# Patient Record
Sex: Female | Born: 1990 | Race: White | Hispanic: No | Marital: Single | Smoking: Never smoker
Health system: Southern US, Community
[De-identification: ages and names within clinical notes are randomized; demographics above are authoritative.]

## PROBLEM LIST (undated history)

## (undated) DIAGNOSIS — F329 Major depressive disorder, single episode, unspecified: Secondary | ICD-10-CM

## (undated) DIAGNOSIS — F32A Depression, unspecified: Secondary | ICD-10-CM

## (undated) DIAGNOSIS — J45909 Unspecified asthma, uncomplicated: Secondary | ICD-10-CM

## (undated) HISTORY — DX: Depression, unspecified: F32.A

## (undated) HISTORY — DX: Major depressive disorder, single episode, unspecified: F32.9

## (undated) HISTORY — PX: TONSILLECTOMY: SUR1361

---
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
## 2012-02-06 ENCOUNTER — Other Ambulatory Visit: Payer: Self-pay | Admitting: *Deleted

## 2012-02-06 MED ORDER — NORGESTIM-ETH ESTRAD TRIPHASIC 0.18/0.215/0.25 MG-25 MCG PO TABS: 1.0000 | ORAL_TABLET | Freq: Every day | ORAL | Status: DC

## 2012-07-05 ENCOUNTER — Encounter: Payer: Self-pay | Admitting: Emergency Medicine

## 2012-07-05 ENCOUNTER — Emergency Department (INDEPENDENT_AMBULATORY_CARE_PROVIDER_SITE_OTHER)
Admission: EM | Admit: 2012-07-05 | Discharge: 2012-07-05 | Disposition: A | Discharge status: Home / Self Care | Payer: 59 | Source: Home / Self Care | Attending: Family Medicine | Admitting: Family Medicine

## 2012-07-05 DIAGNOSIS — J029 Acute pharyngitis, unspecified: Secondary | ICD-10-CM

## 2012-07-05 DIAGNOSIS — J309 Allergic rhinitis, unspecified: Secondary | ICD-10-CM

## 2012-07-05 MED ORDER — FLUTICASONE PROPIONATE 50 MCG/ACT NA SUSP: 2.0000 | Freq: Every day | NASAL | Status: DC

## 2012-07-05 MED ORDER — CETIRIZINE HCL 10 MG PO CAPS: 10.0000 mg | ORAL_CAPSULE | Freq: Every day | ORAL | Status: DC

## 2012-07-05 NOTE — ED Provider Notes (Signed)
History     CSN: [id number]  Arrival date & time 07/05/12  0945   First MD Initiated Contact with Patient 07/05/12 1022      Chief Complaint  Patient presents with  . Sore Throat   HPI  URI Symptoms Onset: 2-3 weeks  Description: intermittent sore throat and cervical LAD Modifying factors:  Baseline hx/o allergies. Intermittently taking antihistamine.   Symptoms Nasal discharge: mild post nasal drip  Fever: n Sore throat: yes Cough: no Wheezing: no Ear pain: no GI symptoms: no Sick contacts: yes  Red Flags  Stiff neck: no Dyspnea: no Rash: no Swallowing difficulty: no  Sinusitis Risk Factors Headache/face pain: no Double sickening: no tooth pain: no  Allergy Risk Factors Sneezing: mild Itchy scratchy throat: yes Seasonal symptoms: yes  Flu Risk Factors Headache: no muscle aches: no severe fatigue: no   Past Medical History  Diagnosis Date  . Depression     Past Surgical History  Procedure Laterality Date  . Tonsillectomy      Family History  Problem Relation Age of Onset  . Diabetes Maternal Grandfather   . Diabetes Paternal Grandmother   . Cancer Paternal Grandfather     History  Substance Use Topics  . Smoking status: Never Smoker   . Smokeless tobacco: Not on file  . Alcohol Use: Not on file    OB History   Grav Para Term Preterm Abortions TAB SAB Ect Mult Living                  Review of Systems  All other systems reviewed and are negative.    Allergies  Sulfonamide derivatives  Home Medications   Current Outpatient Rx  Name  Route  Sig  Dispense  Refill  . Norgestimate-Ethinyl Estradiol Triphasic (ORTHO TRI-CYCLEN LO) 0.18/0.215/0.25 MG-25 MCG tab   Oral   Take 1 tablet by mouth daily.   1 Package   11   . sertraline (ZOLOFT) 100 MG tablet   Oral   Take 100 mg by mouth daily.           BP 129/84  Pulse 67  Temp(Src) 98.1 F (36.7 C) (Oral)  Ht 5\' 3"  (1.6 m)  Wt 158 lb (71.668 kg)  BMI 28 kg/m2   SpO2 96%  LMP 06/27/2012  Physical Exam  Constitutional: She appears well-developed and well-nourished.  HENT:  Head: Normocephalic and atraumatic.  Right Ear: External ear normal.  Left Ear: External ear normal.  +nasal erythema, rhinorrhea bilaterally, + post oropharyngeal erythema  S/p tonsillectomy     Eyes: Conjunctivae are normal. Pupils are equal, round, and reactive to light.  Neck: Normal range of motion. Neck supple.  Cardiovascular: Normal rate, regular rhythm and normal heart sounds.   Pulmonary/Chest: Effort normal and breath sounds normal.  Abdominal: Soft.  Musculoskeletal: Normal range of motion.  Lymphadenopathy:    She has cervical adenopathy.  Neurological: She is alert.  Skin: Skin is warm.    ED Course  Procedures (including critical care time)  Labs Reviewed  POCT MONO SCREEN [facility])   No results found.   1. Allergic rhinitis       MDM  Rapid strep and mono negative.  Suspect partially treated allergic rhinitis.  Will start on flonase and zyrtec.  Discussed general care and infectious/ENT red flags.  Follow up as needed.     The patient and/or caregiver has been counseled thoroughly with regard to treatment plan and/or medications prescribed including dosage, schedule, interactions, rationale  for use, and possible side effects and they verbalize understanding. Diagnoses and expected course of recovery discussed and will return if not improved as expected or if the condition worsens. Patient and/or caregiver verbalized understanding.             Doree Albee, MD 07/05/12 1047

## 2012-07-05 NOTE — ED Notes (Signed)
Sore throat, intermittent for a few weeks, worse yesterday

## 2012-11-22 ENCOUNTER — Encounter: Payer: Self-pay | Admitting: Physician Assistant

## 2012-11-22 ENCOUNTER — Ambulatory Visit (INDEPENDENT_AMBULATORY_CARE_PROVIDER_SITE_OTHER): Payer: 59 | Admitting: Physician Assistant

## 2012-11-22 ENCOUNTER — Other Ambulatory Visit: Payer: Self-pay | Admitting: Physician Assistant

## 2012-11-22 VITALS — BP 150/78 | HR 88 | Wt 155.0 lb

## 2012-11-22 DIAGNOSIS — N920 Excessive and frequent menstruation with regular cycle: Secondary | ICD-10-CM

## 2012-11-22 DIAGNOSIS — R030 Elevated blood-pressure reading, without diagnosis of hypertension: Secondary | ICD-10-CM

## 2012-11-22 NOTE — Progress Notes (Signed)
  Subjective:    Patient ID: Anita Baird, female    DOB: 1990/06/21, 22 y.o.   MRN: [id number]  HPI Patient presents to the clinic because she has had vaginal bleeding for 4 weeks. Her menstrual cycle started July 12th and is still occuring. Her bleeding has decreased significantly and on a daily basis only having to change tampon twice a day. She denies any abdominal pain other than the mild cramps at the start of her period. She denies any new medications. She started OCP about 1 year ago with no problems until now. She is sexually active. Denies any discharge. No pain during intercourse. Stress and activity level the same. She has not had any vaginal itching or odor.     Review of Systems  All other systems reviewed and are negative.       Objective:   Physical Exam  Constitutional: She is oriented to person, place, and time. She appears well-developed and well-nourished.  HENT:  Head: Normocephalic and atraumatic.  Neck: Normal range of motion. Neck supple. No thyromegaly present.  Cardiovascular: Normal rate, regular rhythm and normal heart sounds.   Pulmonary/Chest: Effort normal and breath sounds normal.  Abdominal: Soft. Bowel sounds are normal. She exhibits no distension and no mass. There is no tenderness. There is no rebound and no guarding.  Neurological: She is alert and oriented to person, place, and time.  Skin: Skin is warm and dry.  Psychiatric: She has a normal mood and affect. Her behavior is normal.          Assessment & Plan:  Menorrhagia- Stay on OCP. Will check TSH, prolactin and CBC. Will get pelvic ultrasound. If everything is negative could consider increasing OCP or putting on a trial of progesterone. Will call pt with results.   Elevated blood pressure reading- inadvertently forgot to recheck during encounter. No hx of BP issues. Will continue to monitor at future visits. Pt was certainly nervous about what she was talking about today and could have  increased BP.

## 2012-11-23 ENCOUNTER — Ambulatory Visit (HOSPITAL_BASED_OUTPATIENT_CLINIC_OR_DEPARTMENT_OTHER)
Admission: RE | Admit: 2012-11-23 | Discharge: 2012-11-23 | Disposition: A | Discharge status: Home / Self Care | Payer: 59 | Source: Ambulatory Visit | Attending: Physician Assistant | Admitting: Physician Assistant

## 2012-11-23 DIAGNOSIS — N920 Excessive and frequent menstruation with regular cycle: Secondary | ICD-10-CM | POA: Insufficient documentation

## 2012-11-23 LAB — CBC
HCT: 41.3 % (ref 36.0–46.0)
MCHC: 33.9 g/dL (ref 30.0–36.0)
MCV: 85.9 fL (ref 78.0–100.0)
RDW: 13.1 % (ref 11.5–15.5)

## 2012-11-24 ENCOUNTER — Other Ambulatory Visit: Payer: Self-pay | Admitting: Physician Assistant

## 2012-11-24 MED ORDER — NORGESTIM-ETH ESTRAD TRIPHASIC 0.18/0.215/0.25 MG-35 MCG PO TABS: 1.0000 | ORAL_TABLET | Freq: Every day | ORAL | Status: DC

## 2012-11-24 NOTE — Progress Notes (Signed)
Sent regular ortho tri cyclen instead of low.

## 2013-02-01 ENCOUNTER — Other Ambulatory Visit: Payer: Self-pay | Admitting: *Deleted

## 2013-02-01 MED ORDER — NORGESTIM-ETH ESTRAD TRIPHASIC 0.18/0.215/0.25 MG-35 MCG PO TABS: 1.0000 | ORAL_TABLET | Freq: Every day | ORAL | Status: DC

## 2013-07-14 ENCOUNTER — Other Ambulatory Visit: Payer: Self-pay | Admitting: Family Medicine

## 2013-10-11 ENCOUNTER — Other Ambulatory Visit: Payer: Self-pay | Admitting: Physician Assistant

## 2013-12-06 ENCOUNTER — Ambulatory Visit (INDEPENDENT_AMBULATORY_CARE_PROVIDER_SITE_OTHER): Payer: 59 | Admitting: Physician Assistant

## 2013-12-06 ENCOUNTER — Encounter: Payer: Self-pay | Admitting: Physician Assistant

## 2013-12-06 VITALS — BP 117/79 | HR 76 | Ht 63.0 in | Wt 156.0 lb

## 2013-12-06 DIAGNOSIS — J45909 Unspecified asthma, uncomplicated: Secondary | ICD-10-CM

## 2013-12-06 DIAGNOSIS — J302 Other seasonal allergic rhinitis: Secondary | ICD-10-CM

## 2013-12-06 DIAGNOSIS — N921 Excessive and frequent menstruation with irregular cycle: Secondary | ICD-10-CM

## 2013-12-06 DIAGNOSIS — J309 Allergic rhinitis, unspecified: Secondary | ICD-10-CM

## 2013-12-06 DIAGNOSIS — Z3009 Encounter for other general counseling and advice on contraception: Secondary | ICD-10-CM

## 2013-12-06 DIAGNOSIS — N920 Excessive and frequent menstruation with regular cycle: Secondary | ICD-10-CM

## 2013-12-06 MED ORDER — MONTELUKAST SODIUM 10 MG PO TABS: 10.0000 mg | ORAL_TABLET | Freq: Every day | ORAL | Status: DC

## 2013-12-06 MED ORDER — NORGESTIM-ETH ESTRAD TRIPHASIC 0.18/0.215/0.25 MG-35 MCG PO TABS: ORAL_TABLET | ORAL | Status: DC

## 2013-12-06 MED ORDER — CETIRIZINE-PSEUDOEPHEDRINE ER 5-120 MG PO TB12
1.0000 | ORAL_TABLET | Freq: Two times a day (BID) | ORAL | Status: DC
Start: 2013-12-06 — End: 2017-11-09

## 2013-12-06 MED ORDER — ALBUTEROL SULFATE 108 (90 BASE) MCG/ACT IN AEPB: 2.0000 | INHALATION_SPRAY | RESPIRATORY_TRACT | Status: DC | PRN

## 2013-12-06 NOTE — Progress Notes (Signed)
   Subjective:    Patient ID: Anita Baird, female    DOB: June 25, 1990, 23 y.o.   MRN: [id number]  HPI Pt presents to the clinic to follow up for OCP. She has been on OCP for over a year and has no problems or concerns. Periods are monthly and regular. No abnormal bleeding or cramping.   Pt also reports using rescue inhaler almost every day for past 3 months. NO ER visit and not causing her to not do physical activity. She does complain of chest tightness, wheezing and SOB. She feels like trigger more by going outside. She does have seasonal allergies. She used to be on singulair and helped a lot.    Review of Systems  All other systems reviewed and are negative.      Objective:   Physical Exam  Constitutional: She is oriented to person, place, and time. She appears well-developed and well-nourished.  HENT:  Head: Normocephalic and atraumatic.  Cardiovascular: Normal rate, regular rhythm and normal heart sounds.   Pulmonary/Chest: Effort normal and breath sounds normal.  Neurological: She is alert and oriented to person, place, and time.  Psychiatric: She has a normal mood and affect. Her behavior is normal.          Assessment & Plan:  Extrinsic asthma, mild/seasonal allergies- Will add back Singulair  daily. Continue with zyrtec D once daily. Albuterol inhaler given to use as needed. Discussed if continues to use rescue more than 3 times a week to follow up in clinic to discuss.   Contraception management- refilled OCP for one year. Discussed pap smear next year. Pt aware OCP not protect against STI and to use condoms.  decines gardisil.   Discussed with patient need for fasting labs. She would like to wait until CPE next year.    Pt declined flu shot today.

## 2013-12-12 ENCOUNTER — Encounter: Payer: Self-pay | Admitting: Physician Assistant

## 2013-12-12 ENCOUNTER — Ambulatory Visit (INDEPENDENT_AMBULATORY_CARE_PROVIDER_SITE_OTHER): Payer: 59 | Admitting: Physician Assistant

## 2013-12-12 VITALS — BP 119/70 | HR 79 | Temp 98.2°F | Ht 63.0 in | Wt 156.0 lb

## 2013-12-12 DIAGNOSIS — J45909 Unspecified asthma, uncomplicated: Secondary | ICD-10-CM

## 2013-12-12 DIAGNOSIS — J0100 Acute maxillary sinusitis, unspecified: Secondary | ICD-10-CM | POA: Insufficient documentation

## 2013-12-12 MED ORDER — METHYLPREDNISOLONE SODIUM SUCC 125 MG IJ SOLR
125.0000 mg | Freq: Once | INTRAMUSCULAR | Status: AC
  Administered 2013-12-12: 125 mg via INTRAMUSCULAR

## 2013-12-12 MED ORDER — AMOXICILLIN-POT CLAVULANATE 875-125 MG PO TABS: 1.0000 | ORAL_TABLET | Freq: Two times a day (BID) | ORAL | Status: DC

## 2013-12-12 NOTE — Patient Instructions (Signed)

## 2013-12-13 NOTE — Progress Notes (Signed)
   Subjective:    Patient ID: Anita Baird, female    DOB: [birth year], 22 y.o.   MRN: [id number]  HPI Pt reports to the clinic with worsening ST, sinus pressure, nasal congestion. She has had symptoms for over a week at this point. She had OV last week and treated for extrinsic asthma and allergies. She feels like she can breath a little better but still feels tight in her chest. Albuterol inhaler does seem to help. No fever, chills. She has used sudafed with no relief. Continues to take zyrtec D and singulair.    Review of Systems  All other systems reviewed and are negative.      Objective:   Physical Exam  Constitutional: She is oriented to person, place, and time. She appears well-developed and well-nourished.  HENT:  Head: Normocephalic and atraumatic.  Right Ear: External ear normal.  Left Ear: External ear normal.  Mouth/Throat: Oropharynx is clear and moist. No oropharyngeal exudate.  TM's clear bilaterally clear. No blood or pus.   Left sided maxillary tenderness.   Nasal turbinates red and swollen.   Eyes: Conjunctivae are normal. Right eye exhibits no discharge. Left eye exhibits no discharge.  Neck: Normal range of motion. Neck supple.  Cardiovascular: Normal rate, regular rhythm and normal heart sounds.   Pulmonary/Chest: Effort normal and breath sounds normal. She has no wheezes.  Lymphadenopathy:    She has no cervical adenopathy.  Neurological: She is alert and oriented to person, place, and time.  Skin: Skin is dry.  Psychiatric: She has a normal mood and affect. Her behavior is normal.          Assessment & Plan:  Acute maxillary sinusitis/extrinsic asthma- solu-medrol  given IM today. augmentin for 10 days. Continue albuterol rescue as needed. If not improving may need to consider daily ICS.  please follow up in 1 week if symptoms not improving. HO given.

## 2014-06-01 ENCOUNTER — Encounter: Payer: Self-pay | Admitting: *Deleted

## 2014-06-01 ENCOUNTER — Emergency Department (INDEPENDENT_AMBULATORY_CARE_PROVIDER_SITE_OTHER)
Admission: EM | Admit: 2014-06-01 | Discharge: 2014-06-01 | Disposition: A | Discharge status: Home / Self Care | Payer: 59 | Source: Home / Self Care | Attending: Emergency Medicine | Admitting: Emergency Medicine

## 2014-06-01 DIAGNOSIS — J209 Acute bronchitis, unspecified: Secondary | ICD-10-CM | POA: Diagnosis not present

## 2014-06-01 DIAGNOSIS — J0101 Acute recurrent maxillary sinusitis: Secondary | ICD-10-CM

## 2014-06-01 DIAGNOSIS — J4521 Mild intermittent asthma with (acute) exacerbation: Secondary | ICD-10-CM | POA: Diagnosis not present

## 2014-06-01 HISTORY — DX: Unspecified asthma, uncomplicated: J45.909

## 2014-06-01 MED ORDER — AMOXICILLIN-POT CLAVULANATE 875-125 MG PO TABS: 1.0000 | ORAL_TABLET | Freq: Two times a day (BID) | ORAL | Status: DC

## 2014-06-01 MED ORDER — METHYLPREDNISOLONE ACETATE 80 MG/ML IJ SUSP
80.0000 mg | Freq: Once | INTRAMUSCULAR | Status: AC
  Administered 2014-06-01: 80 mg via INTRAMUSCULAR

## 2014-06-01 NOTE — ED Provider Notes (Signed)
CSN: [id number]     Arrival date & time 06/01/14  1103 History   First MD Initiated Contact with Patient 06/01/14 1123     Chief Complaint  Patient presents with  . Cough  . Nasal Congestion   (Consider location/radiation/quality/duration/timing/severity/associated sxs/prior Treatment) HPI URI HISTORY  Astha is a 24 y.o. female who complains of onset of cough and cold symptoms for 4 days. She feels as if her asthma has flared up.  Have been using over-the-counter treatment which helps a little bit.  Tried albuterol HFA at home, and that helped minimally.  No chills/sweats +  Fever  +  Nasal congestion +  Discolored Post-nasal drainage Mild sinus pain/pressure Mild sore throat  +  Cough, productive of discolored sputum Mild, episodic wheezing Positive chest congestion No hemoptysis No shortness of breath No pleuritic pain  No itchy/red eyes No earache  No nausea No vomiting No abdominal pain No diarrhea  No skin rashes +  Fatigue No myalgias No headache   Past Medical History  Diagnosis Date  . Depression   . Asthma    Past Surgical History  Procedure Laterality Date  . Tonsillectomy     Family History  Problem Relation Age of Onset  . Diabetes Maternal Grandfather   . Diabetes Paternal Grandmother   . Cancer Paternal Grandfather    History  Substance Use Topics  . Smoking status: Never Smoker   . Smokeless tobacco: Not on file  . Alcohol Use: Not on file   OB History    No data available     Review of Systems  All other systems reviewed and are negative.   Allergies  Septra and Sulfonamide derivatives  Home Medications   Prior to Admission medications   Medication Sig Start Date End Date Taking? Authorizing Provider  Albuterol Sulfate (PROAIR RESPICLICK) 108 (90 BASE) MCG/ACT AEPB Inhale 2 puffs into the lungs every 4 (four) hours as needed. 12/06/13   Jade L Breeback, PA-C  amoxicillin-clavulanate (AUGMENTIN) 875-125 MG per tablet Take 1  tablet by mouth 2 (two) times daily. For 10 days. Take with food. 06/01/14   Lajean Manesavid Massey, MD  cetirizine-pseudoephedrine (ZYRTEC-D) 5-120 MG per tablet Take 1 tablet by mouth 2 (two) times daily. 12/06/13   Jade L Breeback, PA-C  montelukast (SINGULAIR) 10 MG tablet Take 1 tablet (10 mg total) by mouth at bedtime. 12/06/13   Jade L Breeback, PA-C  Norgestimate-Ethinyl Estradiol Triphasic (TRI-PREVIFEM) 0.18/0.215/0.25 MG-35 MCG tablet TAKE 1 TABLET BY MOUTH DAILY. 12/06/13   Jade L Breeback, PA-C   BP 135/87 mmHg  Pulse 98  Temp(Src) 98.2 F (36.8 C) (Oral)  Resp 16  Wt 157 lb (71.215 kg)  SpO2 100%  LMP 05/31/2014 Physical Exam  Constitutional: She is oriented to person, place, and time. She appears well-developed and well-nourished. No distress.  HENT:  Head: Normocephalic and atraumatic.  Right Ear: Tympanic membrane, external ear and ear canal normal.  Left Ear: Tympanic membrane, external ear and ear canal normal.  Nose: Mucosal edema and rhinorrhea present. Right sinus exhibits maxillary sinus tenderness. Left sinus exhibits maxillary sinus tenderness.  Mouth/Throat: Oropharynx is clear and moist. No oral lesions. No oropharyngeal exudate.  Eyes: Right eye exhibits no discharge. Left eye exhibits no discharge. No scleral icterus.  Neck: Neck supple.  Cardiovascular: Normal rate, regular rhythm and normal heart sounds.   Pulmonary/Chest: Effort normal. She has wheezes (mild, late expiratory. Breath sounds equal bilaterally). She has rhonchi. She has no rales.  Lymphadenopathy:  She has no cervical adenopathy.  Neurological: She is alert and oriented to person, place, and time.  Skin: Skin is warm and dry.  Psychiatric: She has a normal mood and affect.  Nursing note and vitals reviewed.   ED Course  Procedures (including critical care time) Labs Review Labs Reviewed - No data to display  Imaging Review No results found.   MDM   1. Acute bronchitis, unspecified organism    2. Asthma with acute exacerbation, mild intermittent   3. Acute recurrent maxillary sinusitis    Treatment options discussed, as well as risks, benefits, alternatives. Patient voiced understanding and agreement with the following plans: Discharge Medication List as of 06/01/2014 12:02 PM    START taking these medications   Details  amoxicillin-clavulanate (AUGMENTIN) 875-125 MG per tablet Take 1 tablet by mouth 2 (two) times daily. For 10 days. Take with food., Starting 06/01/2014, Until Discontinued, Normal       Depo-Medrol 80 mg IM stat Albuterol HFA Q4 to 6 hours as needed. She declined DuoNeb nebulizer here in urgent care.  She declined oral steroids  Other symptomatic care discussed. Follow-up with your primary care doctor in 2-3 days if not improving, or sooner if symptoms become worse. Precautions discussed. Red flags discussed. Questions invited and answered. Patient voiced understanding and agreement.     Lajean Manes, MD 06/01/14 [place]

## 2014-06-01 NOTE — ED Notes (Signed)
Anita Baird c/o sore throat, HA, runny nose x 3 days. Then developed productive cough. H/o asthma that has flared up.

## 2014-11-06 IMAGING — US US TRANSVAGINAL NON-OB
1 series · 14 of 25 positions shown · non-contrast
Comparison: None

CLINICAL DATA: Menorrhagia.  LMP 10/21/2012.

TRANSABDOMINAL AND TRANSVAGINAL ULTRASOUND OF PELVIS
TECHNIQUE: Both transabdominal and transvaginal ultrasound
examinations of the pelvis were performed. Transabdominal technique
was performed for global imaging of the pelvis including uterus,
ovaries, adnexal regions, and pelvic cul-de-sac.
It was necessary to proceed with endovaginal exam following the
transabdominal exam to visualize the uterus, endometrium, ovaries,
and adnexa.

[Series 1: us transvaginal non-ob · 0.23mm/px · 14 of 50 slices shown]
[im 1/50]
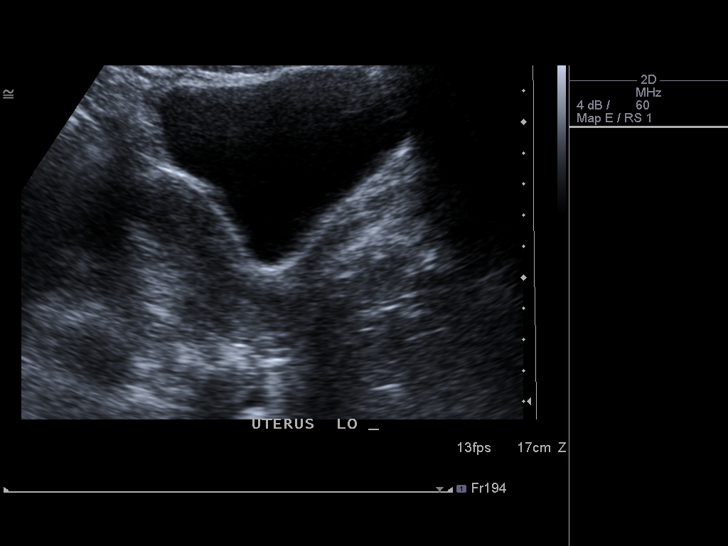
[im 5/50]
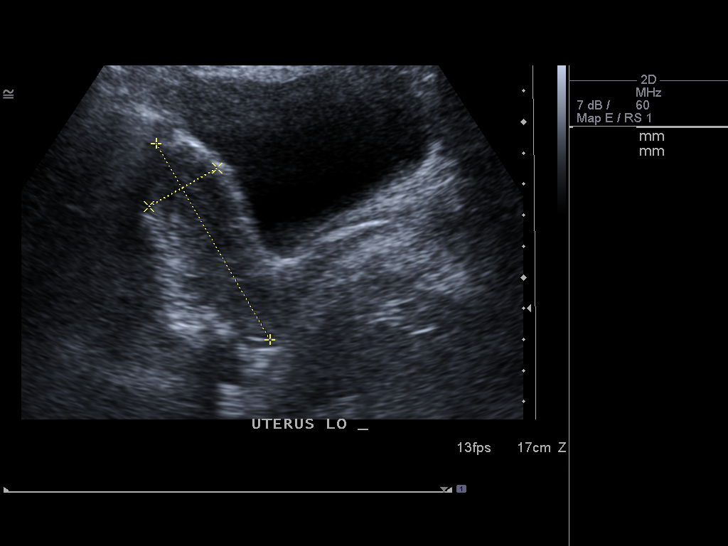
[im 9/50]
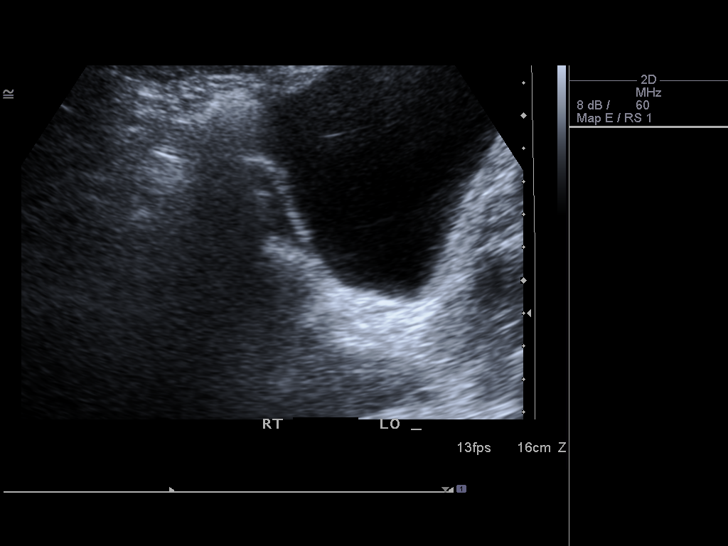
[im 13/50]
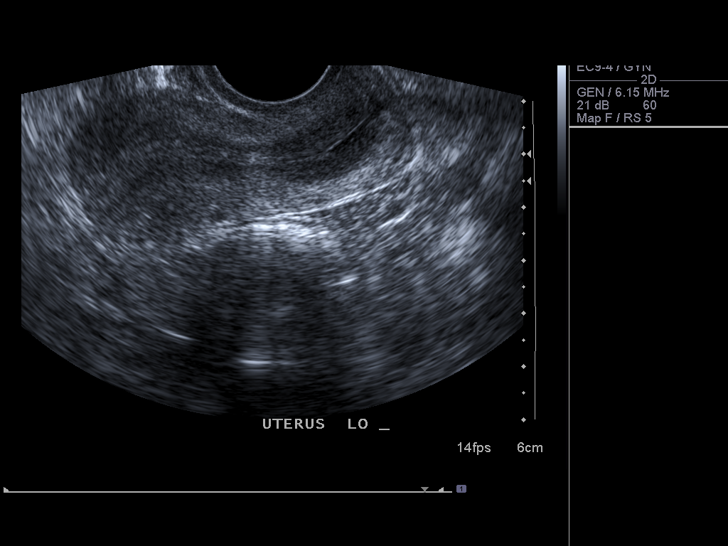
[im 17/50]
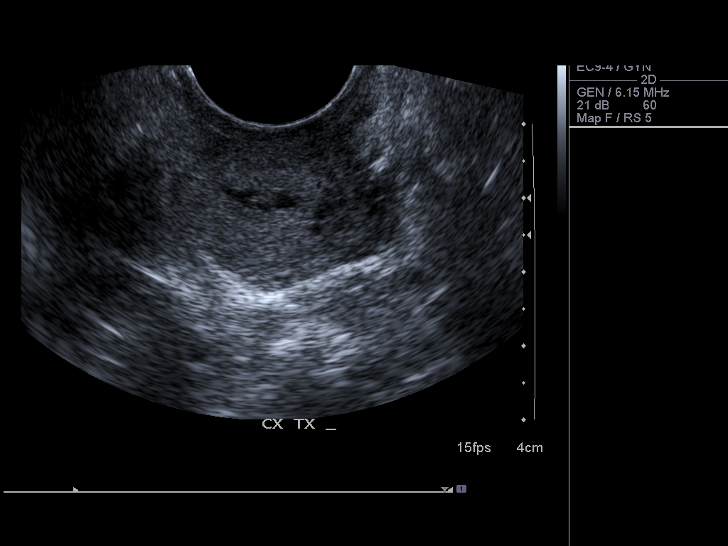
[im 19/50]
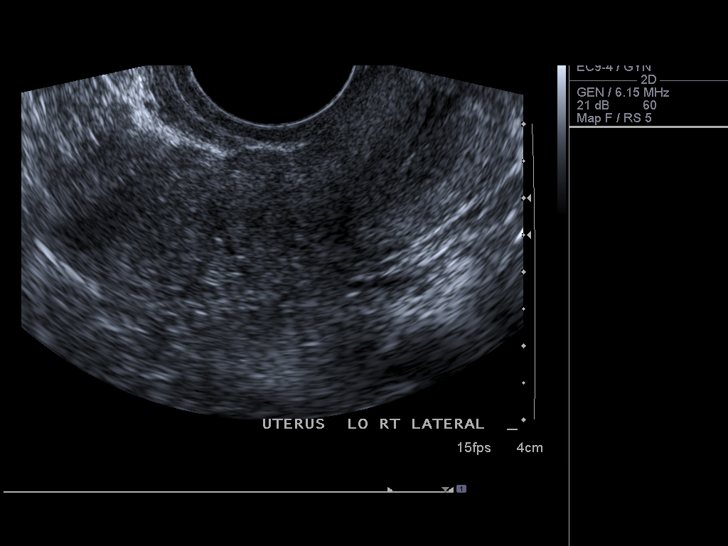
[im 23/50]
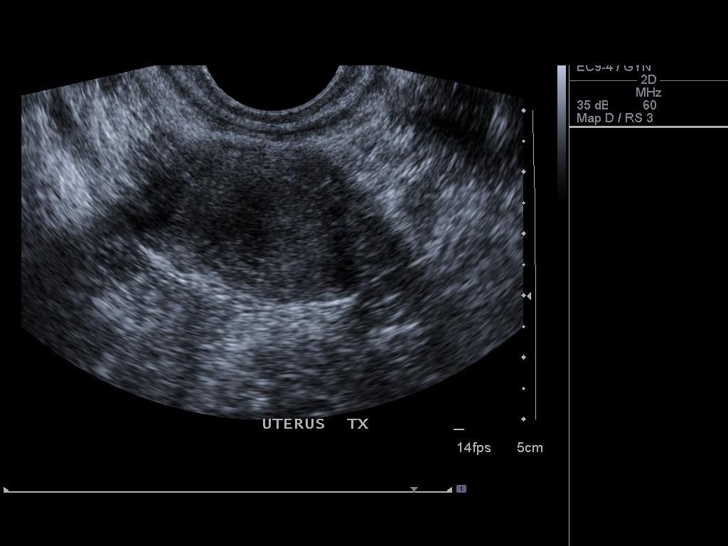
[im 27/50]
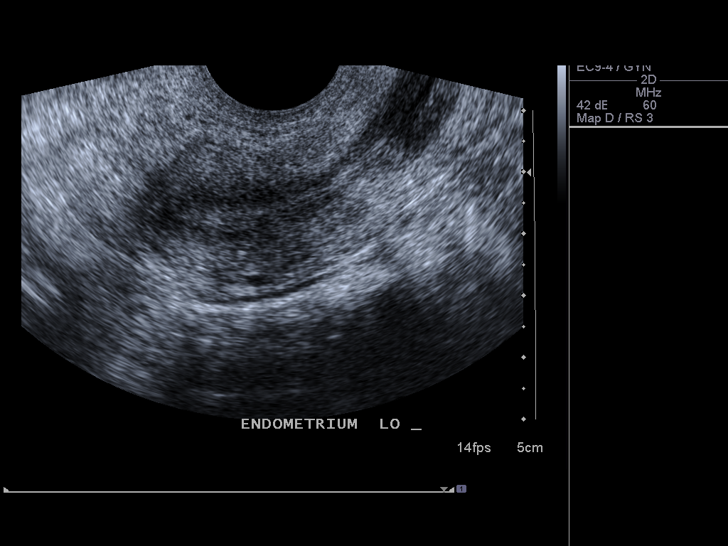
[im 31/50]
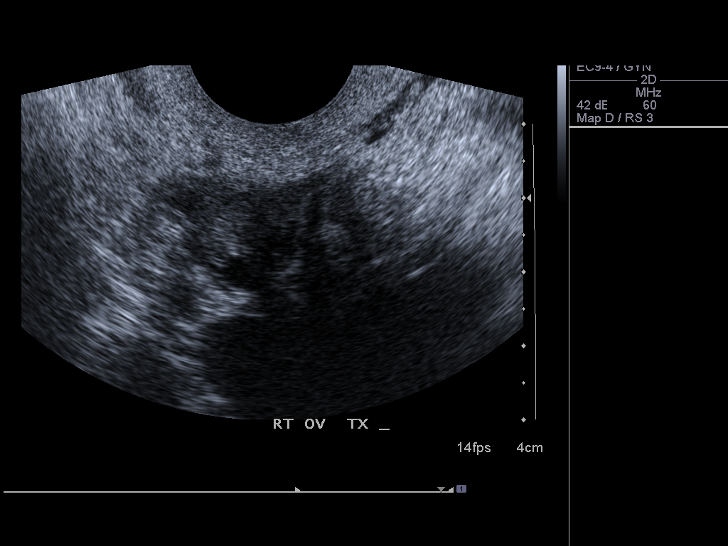
[im 33/50]
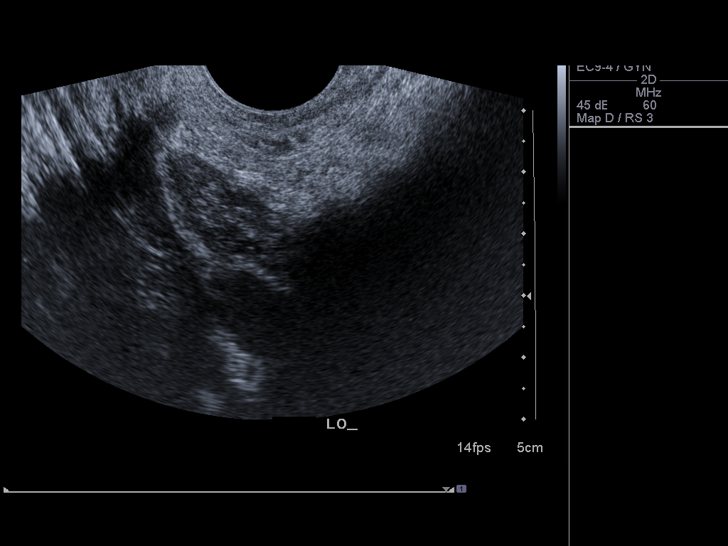
[im 37/50]
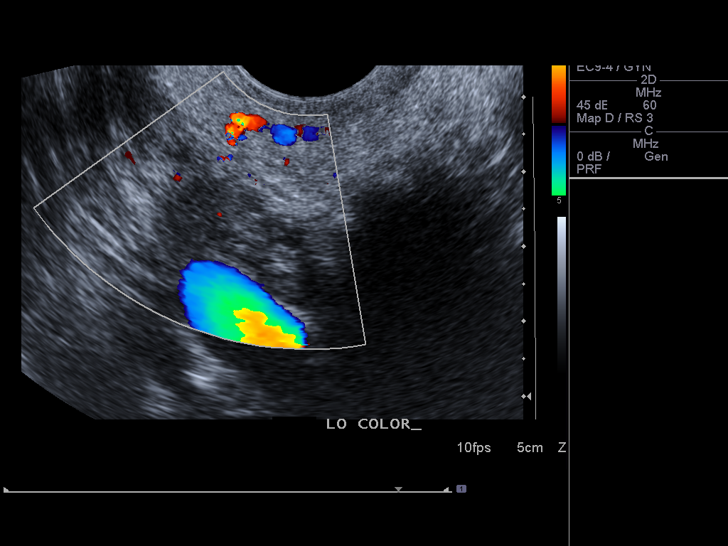
[im 41/50]
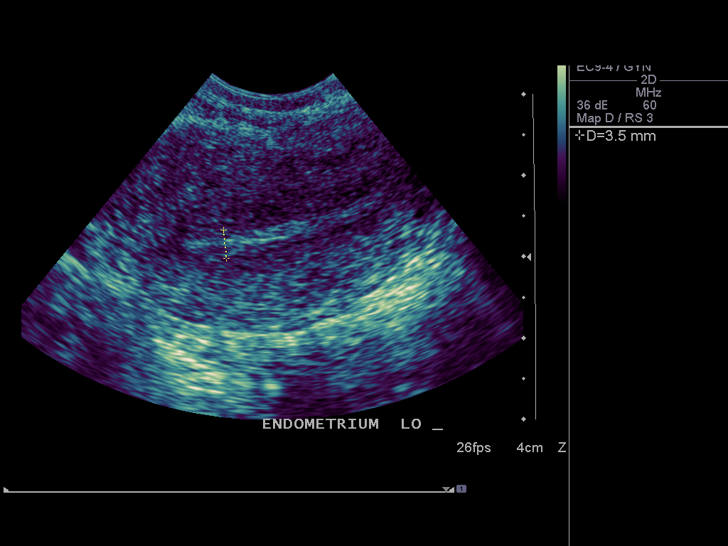
[im 45/50]
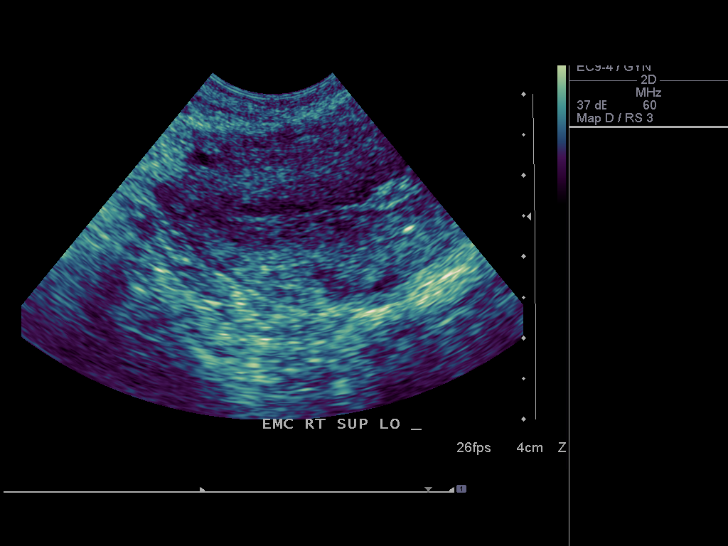
[im 50/50]
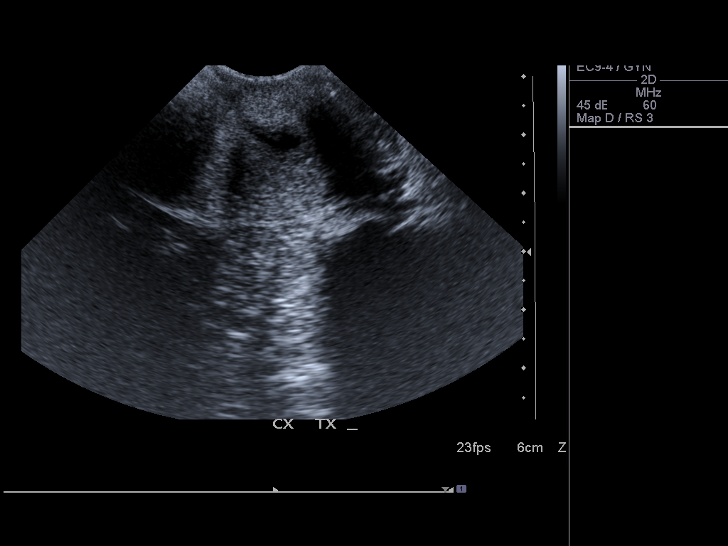

[14 of 25 positions shown; findings below may reference images not displayed]

FINDINGS: Uterus: The uterus is anteverted and measures 6.4 x 2.8 x 4.5 cm.
The myometrium is homogeneous.  No focal uterine mass is
identified.

Endometrium: The endometrium is normal in thickness and appearance.
Endometrial thickness is 4 mm.

Right ovary:  Normal appearance/no adnexal mass.  Measures 2.8 x
1.2 x 1.7 cm.

Left ovary: Normal appearance/no adnexal mass.  Measures 3.1 x
x 1.4 cm.

Other findings: No free fluid
IMPRESSION: Normal study. No evidence of pelvic mass or other significant
abnormality.

## 2014-12-13 ENCOUNTER — Other Ambulatory Visit: Payer: Self-pay | Admitting: Physician Assistant

## 2014-12-26 ENCOUNTER — Other Ambulatory Visit (HOSPITAL_COMMUNITY)
Admission: RE | Admit: 2014-12-26 | Discharge: 2014-12-26 | Disposition: A | Discharge status: Home / Self Care | Payer: 59 | Source: Ambulatory Visit | Attending: Physician Assistant | Admitting: Physician Assistant

## 2014-12-26 ENCOUNTER — Ambulatory Visit (INDEPENDENT_AMBULATORY_CARE_PROVIDER_SITE_OTHER): Payer: 59 | Admitting: Physician Assistant

## 2014-12-26 ENCOUNTER — Encounter: Payer: Self-pay | Admitting: Physician Assistant

## 2014-12-26 VITALS — BP 132/76 | HR 85 | Ht 63.0 in | Wt 163.0 lb

## 2014-12-26 DIAGNOSIS — N921 Excessive and frequent menstruation with irregular cycle: Secondary | ICD-10-CM

## 2014-12-26 DIAGNOSIS — Z01419 Encounter for gynecological examination (general) (routine) without abnormal findings: Secondary | ICD-10-CM | POA: Diagnosis not present

## 2014-12-26 DIAGNOSIS — J4520 Mild intermittent asthma, uncomplicated: Secondary | ICD-10-CM | POA: Diagnosis not present

## 2014-12-26 DIAGNOSIS — Z113 Encounter for screening for infections with a predominantly sexual mode of transmission: Secondary | ICD-10-CM | POA: Diagnosis present

## 2014-12-26 DIAGNOSIS — N760 Acute vaginitis: Secondary | ICD-10-CM | POA: Insufficient documentation

## 2014-12-26 DIAGNOSIS — J302 Other seasonal allergic rhinitis: Secondary | ICD-10-CM

## 2014-12-26 MED ORDER — NORGESTIM-ETH ESTRAD TRIPHASIC 0.18/0.215/0.25 MG-35 MCG PO TABS: ORAL_TABLET | ORAL | Status: DC

## 2014-12-26 MED ORDER — ALBUTEROL SULFATE 108 (90 BASE) MCG/ACT IN AEPB: 2.0000 | INHALATION_SPRAY | RESPIRATORY_TRACT | Status: DC | PRN

## 2014-12-26 NOTE — Addendum Note (Signed)
Addended by: Jomarie Longs on: 12/26/2014 12:02 PM   Modules accepted: Level of Service

## 2014-12-26 NOTE — Progress Notes (Addendum)
   Subjective:    Patient ID: Anita Baird, female    DOB: 10/08/1990, 24 y.o.   MRN: [id number]  HPI Patient presents to clinic today for pap smear and refill of birth control. She reports being satisfied with her current contraceptive method and is not experiencing any side effects. She acknowledges birth control does not protect against STDs, and she uses condoms for STD prevention.     Review of Systems Positive for symptoms listed in HPI    Objective:   Physical Exam Gyn: External genitalia normal. Cervix is normal in appearance with transitional zone of cervical os erythematous. On bimanual exam, no adenexal masses or tenderness palpated, uterus is antrograde and normal in size and shape.         Assessment & Plan:  Well adult- Refilled tri-previfem, 1 tablet qd. Pt uses condoms for STD prevention. Declined fasting labs today will get next year and CPE. Pt declined STD testing today.  Asthma- Refilled albuterol inhaler, 1 puff q 4-6 hrs prn. Symptoms controlled.   Reviewed by Tandy Gaw PA-C

## 2014-12-28 LAB — CYTOLOGY - PAP

## 2014-12-29 LAB — CERVICOVAGINAL ANCILLARY ONLY
Bacterial vaginitis: NEGATIVE
Candida vaginitis: NEGATIVE

## 2015-02-04 ENCOUNTER — Other Ambulatory Visit: Payer: Self-pay | Admitting: Physician Assistant

## 2015-11-28 ENCOUNTER — Ambulatory Visit (INDEPENDENT_AMBULATORY_CARE_PROVIDER_SITE_OTHER): Payer: 59 | Admitting: Physician Assistant

## 2015-11-28 ENCOUNTER — Encounter: Payer: Self-pay | Admitting: Physician Assistant

## 2015-11-28 VITALS — BP 126/68 | HR 72 | Ht 63.0 in | Wt 164.0 lb

## 2015-11-28 DIAGNOSIS — R51 Headache: Secondary | ICD-10-CM | POA: Diagnosis not present

## 2015-11-28 DIAGNOSIS — Z308 Encounter for other contraceptive management: Secondary | ICD-10-CM

## 2015-11-28 DIAGNOSIS — R519 Headache, unspecified: Secondary | ICD-10-CM | POA: Insufficient documentation

## 2015-11-28 DIAGNOSIS — Z30019 Encounter for initial prescription of contraceptives, unspecified: Secondary | ICD-10-CM

## 2015-11-28 MED ORDER — NORGESTIM-ETH ESTRAD TRIPHASIC 0.18/0.215/0.25 MG-35 MCG PO TABS: ORAL_TABLET | ORAL | 4 refills | Status: DC

## 2015-11-28 MED ORDER — ALBUTEROL SULFATE 108 (90 BASE) MCG/ACT IN AEPB: 2.0000 | INHALATION_SPRAY | RESPIRATORY_TRACT | 5 refills | Status: DC | PRN

## 2015-11-28 NOTE — Patient Instructions (Signed)
trokendi 25mg  XR daily.   Topiramate extended-release capsules What is this medicine? TOPIRAMATE (toe PYRE a mate) is used to treat seizures in adults or children with epilepsy. This medicine may be used for other purposes; ask your health care provider or pharmacist if you have questions. What should I tell my health care provider before I take this medicine? They need to know if you have any of these conditions: -cirrhosis of the liver or liver disease -diarrhea -glaucoma -kidney stones or kidney disease -lung disease like asthma, obstructive pulmonary disease, emphysema -metabolic acidosis -on a ketogenic diet -scheduled for surgery or a procedure -suicidal thoughts, plans, or attempt; a previous suicide attempt by you or a family member -an unusual or allergic reaction to topiramate, other medicines, foods, dyes, or preservatives -pregnant or trying to get pregnant -breast-feeding How should I use this medicine? Take this medicine by mouth with a glass of water. Follow the directions on the prescription label. Trokendi XR capsules must be swallowed whole. Do not sprinkle on food, break, crush, dissolve, or chew. Qudexy XR capsules may be swallowed whole or opened and sprinkled on a small amount of soft food. This mixture must be swallowed immediately. Do not chew or store mixture for later use. You may take this medicine with meals. Take your medicine at regular intervals. Do not take it more often than directed. Talk to your pediatrician regarding the use of this medicine in children. Special care may be needed. While Trokendi XR may be prescribed for children as young as 6 years and Qudexy XR may be prescribed for children as young as 2 years for selected conditions, precautions do apply. Overdosage: If you think you have taken too much of this medicine contact a poison control center or emergency room at once. NOTE: This medicine is only for you. Do not share this medicine with  others. What if I miss a dose? If you miss a dose, take it as soon as you can. If it is almost time for your next dose, take only that dose. Do not take double or extra doses. What may interact with this medicine? Do not take this medicine with any of the following medications: -probenecid This medicine may also interact with the following medications: -acetazolamide -alcohol -amitriptyline -birth control pills -digoxin -hydrochlorothiazide -lithium -medicines for pain, sleep, or muscle relaxation -metformin -methazolamide -other seizure or epilepsy medicines -pioglitazone -risperidone This list may not describe all possible interactions. Give your health care provider a list of all the medicines, herbs, non-prescription drugs, or dietary supplements you use. Also tell them if you smoke, drink alcohol, or use illegal drugs. Some items may interact with your medicine. What should I watch for while using this medicine? Visit your doctor or health care professional for regular checks on your progress. Do not stop taking this medicine suddenly. This increases the risk of seizures if you are using this medicine to control epilepsy. Wear a medical identification bracelet or chain to say you have epilepsy or seizures, and carry a card that lists all your medicines. This medicine can decrease sweating and increase your body temperature. Watch for signs of deceased sweating or fever, especially in children. Avoid extreme heat, hot baths, and saunas. Be careful about exercising, especially in hot weather. Contact your health care provider right away if you notice a fever or decrease in sweating. You should drink plenty of fluids while taking this medicine. If you have had kidney stones in the past, this will help to reduce  your chances of forming kidney stones. If you have stomach pain, with nausea or vomiting and yellowing of your eyes or skin, call your doctor immediately. You may get drowsy,  dizzy, or have blurred vision. Do not drive, use machinery, or do anything that needs mental alertness until you know how this medicine affects you. To reduce dizziness, do not sit or stand up quickly, especially if you are an older patient. Alcohol can increase drowsiness and dizziness. Avoid alcoholic drinks. Do not drink alcohol for 6 hours before or 6 hours after taking Trokendi XR. If you notice blurred vision, eye pain, or other eye problems, seek medical attention at once for an eye exam. The use of this medicine may increase the chance of suicidal thoughts or actions. Pay special attention to how you are responding while on this medicine. Any worsening of mood, or thoughts of suicide or dying should be reported to your health care professional right away. This medicine may increase the chance of developing metabolic acidosis. If left untreated, this can cause kidney stones, bone disease, or slowed growth in children. Symptoms include breathing fast, fatigue, loss of appetite, irregular heartbeat, or loss of consciousness. Call your doctor immediately if you experience any of these side effects. Also, tell your doctor about any surgery you plan on having while taking this medicine since this may increase your risk for metabolic acidosis. Birth control pills may not work properly while you are taking this medicine. Talk to your doctor about using an extra method of birth control. Women who become pregnant while using this medicine may enroll in the [place] American Antiepileptic Drug Pregnancy Registry by calling [phone]. This registry collects information about the safety of antiepileptic drug use during pregnancy. What side effects may I notice from receiving this medicine? Side effects that you should report to your doctor or health care professional as soon as possible: -allergic reactions like skin rash, itching or hives, swelling of the face, lips, or tongue -decreased sweating and/or rise  in body temperature -depression -difficulty breathing, fast or irregular breathing patterns -difficulty speaking -difficulty walking or controlling muscle movements -hearing impairment -redness, blistering, peeling or loosening of the skin, including inside the mouth -tingling, pain or numbness in the hands or feet -unusually weak or tired -worsening of mood, thoughts or actions of suicide or dying Side effects that usually do not require medical attention (Report these to your doctor or health care professional if they continue or are bothersome.): -altered taste -back pain, joint or muscle aches and pains -diarrhea, or constipation -headache -loss of appetite -nausea -stomach upset, indigestion -tremors This list may not describe all possible side effects. Call your doctor for medical advice about side effects. You may report side effects to FDA at 1-800-FDA-1088. Where should I keep my medicine? Keep out of the reach of children. Store at room temperature between 15 and 30 degrees C (59 and 86 degrees F) in a tightly closed container. Protect from moisture. Throw away any unused medicine after the expiration date. NOTE: This sheet is a summary. It may not cover all possible information. If you have questions about this medicine, talk to your doctor, pharmacist, or health care provider.    2016, [employer]vier/Gold Standard. (2012-06-29 15:33:26)

## 2015-11-29 MED ORDER — TOPIRAMATE ER 25 MG PO CAP24: 1.0000 | ORAL_CAPSULE | Freq: Every day | ORAL | 1 refills | Status: DC

## 2015-11-29 NOTE — Progress Notes (Addendum)
   Subjective:    Patient ID: Anita Baird, female    DOB: [birth year], 25 y.o.   MRN: [id number]  HPI   Pt is a 25 yo female who presents to the clinic for OCP refill. No problems or concerns with OCP. She is sexually active with one partner. No nausea or side effects.   She is noticing she is having more frequent headaches for the last 6 months. Her mother and sister both have headaches. She has notice she is having every day. Location of headache changes to left, right, frontal and occipital. She denies any abnormal stress, anxiety, neck pain. She denies any phono or photo sensitivity. No nausea or vomiting. No auras. Sleeping and NSAIDs usually resolve. She is getting tired of having headaches daily.     Review of Systems    see HPI.  Objective:   Physical Exam  Constitutional: She is oriented to person, place, and time. She appears well-developed and well-nourished.  HENT:  Head: Normocephalic and atraumatic.  Right Ear: External ear normal.  Left Ear: External ear normal.  Nose: Nose normal.  Mouth/Throat: Oropharynx is clear and moist.  Eyes: Conjunctivae and EOM are normal. Pupils are equal, round, and reactive to light. Right eye exhibits no discharge. Left eye exhibits no discharge.  Neck: Normal range of motion. Neck supple.  Cardiovascular: Normal rate, regular rhythm and normal heart sounds.   Pulmonary/Chest: Effort normal and breath sounds normal.  Genitourinary: Vagina normal.  Genitourinary Comments: bimanuel exam- no tenderness or masses palpated.    Lymphadenopathy:    She has no cervical adenopathy.  Neurological: She is alert and oriented to person, place, and time.  Psychiatric: She has a normal mood and affect. Her behavior is normal.          Assessment & Plan:  Encounter for Birth Control- pap up to date. bimanuel done today. Refilled OCP for 1 year. Pt declined STI testing.   Frequent headaches- does not sound like migraines or tension headaches.  Unclear etiology.has been on OCP for a while and don't think side effect.  Start trokendi daily. Coupon card given. Continue NSAID for rescue. Keep a HA diary to look for trigger. Follow up in 2 months.

## 2016-01-10 ENCOUNTER — Other Ambulatory Visit: Payer: Self-pay | Admitting: Physician Assistant

## 2016-01-18 ENCOUNTER — Ambulatory Visit: Payer: 59 | Admitting: Physician Assistant

## 2016-02-11 ENCOUNTER — Encounter: Payer: Self-pay | Admitting: Physician Assistant

## 2016-02-12 ENCOUNTER — Other Ambulatory Visit: Payer: Self-pay | Admitting: Physician Assistant

## 2016-02-12 DIAGNOSIS — F329 Major depressive disorder, single episode, unspecified: Secondary | ICD-10-CM

## 2016-02-12 DIAGNOSIS — F419 Anxiety disorder, unspecified: Principal | ICD-10-CM

## 2016-02-20 ENCOUNTER — Encounter: Payer: Self-pay | Admitting: Obstetrics & Gynecology

## 2016-02-20 ENCOUNTER — Ambulatory Visit (INDEPENDENT_AMBULATORY_CARE_PROVIDER_SITE_OTHER): Payer: 59 | Admitting: Obstetrics & Gynecology

## 2016-02-20 VITALS — BP 125/80 | HR 74 | Resp 16 | Ht 63.0 in | Wt 158.0 lb

## 2016-02-20 DIAGNOSIS — N941 Unspecified dyspareunia: Secondary | ICD-10-CM | POA: Diagnosis not present

## 2016-02-20 MED ORDER — LIDOCAINE HCL 2 % EX GEL: 1.0000 "application " | CUTANEOUS | 2 refills | Status: DC | PRN

## 2016-02-20 NOTE — Progress Notes (Signed)
   Subjective:    Patient ID: Anita ArnoldChloe Baird, female    DOB: [birth year], 25 y.o.   MRN: [id number]  HPI  25 yo SW P0 here today to discuss painful intercourse. Off and on for the last year. Not related to her periods which are monthly while on OCPs. Denies vaginal dryness, doesn't need a lubricant. Not positional. Not related to partner as she has been with current BF for 5 years. She denies any h/o dyspareunia with other partners. Pain probably more on the outside but certainly difficult to clarify. Happens about 7 out of 10 tiimes she has sex.  She has not tried any palliative treatments.   In the last few months, it has become painful to wear a tampon.  In general, the relationship with BF is good., no abuse, no BDSM  Review of Systems     Objective:   Physical Exam WNWHWFNAD Breathing, conversing, and ambulating normally Abd- benign Vulva/vagina- all normal No point tenderness with Qtip test all over vulva. I was not able to elicit the pain she describes Bimanual exam normal, no masses, no tenderness NSSA, mobile       Assessment & Plan:  Dyspareunia with vulvar/vaginal swelling/ new difficulty wearing tampom but no evidence of vulvodynia or vestibulitis- She will try using lidocaine jelly prior to sex. If this doesn't work, then rec pelvic PT or second opinion

## 2016-04-09 ENCOUNTER — Ambulatory Visit (INDEPENDENT_AMBULATORY_CARE_PROVIDER_SITE_OTHER): Payer: 59 | Admitting: Obstetrics & Gynecology

## 2016-04-09 ENCOUNTER — Encounter: Payer: Self-pay | Admitting: Obstetrics & Gynecology

## 2016-04-09 VITALS — BP 128/87 | HR 92 | Ht 63.0 in | Wt 158.0 lb

## 2016-04-09 DIAGNOSIS — Z3202 Encounter for pregnancy test, result negative: Secondary | ICD-10-CM | POA: Diagnosis not present

## 2016-04-09 DIAGNOSIS — Z30018 Encounter for initial prescription of other contraceptives: Secondary | ICD-10-CM

## 2016-04-09 DIAGNOSIS — Z30430 Encounter for insertion of intrauterine contraceptive device: Secondary | ICD-10-CM | POA: Diagnosis not present

## 2016-04-09 LAB — POCT URINE PREGNANCY: PREG TEST UR: NEGATIVE

## 2016-04-09 MED ORDER — LEVONORGESTREL 19.5 MG IU IUD
1.0000 | INTRAUTERINE_SYSTEM | Freq: Once | INTRAUTERINE | Status: AC
  Administered 2016-04-09: 1 via INTRAUTERINE

## 2016-04-09 MED ORDER — LIDOCAINE HCL 2 % EX GEL: 1.0000 "application " | CUTANEOUS | 6 refills | Status: DC | PRN

## 2016-04-09 NOTE — Progress Notes (Signed)
   Subjective:    Patient ID: Anita Baird, female    DOB: 1991-02-22, 25 y.o.   MRN: [id number]  HPI 25 yo SW G0 here for follow up of her dyspareunia. She has been using lidocaine jelly prior to sex and says that this has made a large improvement. She is able to have an orgasm. She declines pelvic PT.  She has been on OCPs but forgets them occasionally. She would like to have an IUD placed today.   Review of Systems     Objective:   Physical Exam WNWHWFNAD Breathing, conversing, and ambulating normally UPT negative, consent signed, Time out procedure done. Cervix prepped with betadine and grasped with a single tooth tenaculum. Anita Baird  was easily placed and the strings were cut to 3-4 cm. Uterus sounded to 9 cm. She tolerated the procedure well.      Assessment & Plan:  Contraception- Kyleena, stay on OCPs for the next 2 weeks Prevention- rec'd Gardasil, she will decide in a month at her string check visit Refill lidocaine jelly

## 2016-04-09 NOTE — Addendum Note (Signed)
Addended by: Granville LewisLARK, Tashaya Ancrum L on: 04/09/2016 10:23 AM   Modules accepted: Orders

## 2016-05-07 ENCOUNTER — Encounter: Payer: Self-pay | Admitting: Obstetrics & Gynecology

## 2016-05-07 ENCOUNTER — Ambulatory Visit (INDEPENDENT_AMBULATORY_CARE_PROVIDER_SITE_OTHER): Payer: 59 | Admitting: Obstetrics & Gynecology

## 2016-05-07 VITALS — BP 126/72 | HR 87 | Resp 16 | Ht 64.0 in | Wt 158.0 lb

## 2016-05-07 DIAGNOSIS — Z30431 Encounter for routine checking of intrauterine contraceptive device: Secondary | ICD-10-CM

## 2016-05-07 DIAGNOSIS — Z23 Encounter for immunization: Secondary | ICD-10-CM

## 2016-05-07 DIAGNOSIS — Z0000 Encounter for general adult medical examination without abnormal findings: Secondary | ICD-10-CM

## 2016-05-07 NOTE — Progress Notes (Signed)
   Subjective:    Patient ID: Anita ArnoldChloe Baird, female    DOB: 03/22/1991, 26 y.o.   MRN: [id number]  HPI 26 yo SW G0 here for a string check. She had a Kyleena placed about a month ago. She had spotting for the first 3 weeks, but nothing since, cramps for the first week but fine now. Her partner can't feel the strings. She has no complaints.   Review of Systems     Objective:   Physical Exam WNWHWFNAD Breathing, conversing, and ambulating normally IUD strings seen     Assessment & Plan:  ContraceptionRutha Bouchard- Kyleena Preventative care- start Gardasil series today

## 2016-05-28 ENCOUNTER — Telehealth: Payer: Self-pay | Admitting: Physician Assistant

## 2016-05-28 NOTE — Telephone Encounter (Signed)
Called pt about flu shot lvm 05/28/16 @ 3:03pm

## 2016-07-09 ENCOUNTER — Ambulatory Visit (INDEPENDENT_AMBULATORY_CARE_PROVIDER_SITE_OTHER): Payer: 59

## 2016-07-09 DIAGNOSIS — Z23 Encounter for immunization: Secondary | ICD-10-CM

## 2016-07-09 NOTE — Progress Notes (Signed)
Pt came today for her second Gardasil shot. Shot was given in left arm. Appt was made for the third Gardasil shot.

## 2016-10-10 ENCOUNTER — Other Ambulatory Visit: Payer: Self-pay | Admitting: Physician Assistant

## 2016-10-29 ENCOUNTER — Ambulatory Visit (INDEPENDENT_AMBULATORY_CARE_PROVIDER_SITE_OTHER): Payer: 59 | Admitting: *Deleted

## 2016-10-29 DIAGNOSIS — Z23 Encounter for immunization: Secondary | ICD-10-CM

## 2017-03-10 ENCOUNTER — Other Ambulatory Visit: Payer: Self-pay | Admitting: Obstetrics & Gynecology

## 2017-05-29 ENCOUNTER — Other Ambulatory Visit: Payer: Self-pay

## 2017-05-29 ENCOUNTER — Emergency Department (INDEPENDENT_AMBULATORY_CARE_PROVIDER_SITE_OTHER)
Admission: EM | Admit: 2017-05-29 | Discharge: 2017-05-29 | Disposition: A | Discharge status: Home / Self Care | Payer: 59 | Source: Home / Self Care | Attending: Family Medicine | Admitting: Family Medicine

## 2017-05-29 DIAGNOSIS — J069 Acute upper respiratory infection, unspecified: Secondary | ICD-10-CM

## 2017-05-29 DIAGNOSIS — J4521 Mild intermittent asthma with (acute) exacerbation: Secondary | ICD-10-CM

## 2017-05-29 MED ORDER — AZITHROMYCIN 250 MG PO TABS: 250.0000 mg | ORAL_TABLET | Freq: Every day | ORAL | 0 refills | Status: DC

## 2017-05-29 MED ORDER — BENZONATATE 100 MG PO CAPS: 100.0000 mg | ORAL_CAPSULE | Freq: Three times a day (TID) | ORAL | 0 refills | Status: DC

## 2017-05-29 MED ORDER — METHYLPREDNISOLONE ACETATE 80 MG/ML IJ SUSP
80.0000 mg | Freq: Once | INTRAMUSCULAR | Status: AC
  Administered 2017-05-29: 80 mg via INTRAMUSCULAR

## 2017-05-29 NOTE — ED Provider Notes (Signed)
[facility]    CSN: [id number] Arrival date & time: 05/29/17  1031     History   Chief Complaint Chief Complaint  Patient presents with  . Fever  . Cough  . Nasal Congestion    HPI Anita ArnoldChloe Baird is a 27 y.o. female.   HPI Anita ArnoldChloe Baird is a 27 y.o. female presenting to UC with c/o asthma exacerbation that started 2 nights ago with cough, congestion and mild sore throat. Last night she developed a fever of 100*F and wheezing.  She has used her albuterol inhaler with temporary relief.  Denies HA or body aches. Denies chest pain or SOB at this time.  Denies known sick contacts or recent travel. She does not believe she has the flu.  She notes a prednisone shot often helps a lot.    Past Medical History:  Diagnosis Date  . Asthma   . Depression     Patient Active Problem List   Diagnosis Date Noted  . Dyspareunia in female 02/20/2016  . Encounter for female birth control 11/28/2015  . Frequent headaches 11/28/2015  . Acute maxillary sinusitis 12/12/2013  . Menorrhagia with irregular cycle 12/06/2013  . Extrinsic asthma 12/06/2013  . Seasonal allergies 12/06/2013    Past Surgical History:  Procedure Laterality Date  . TONSILLECTOMY      OB History    Gravida Para Term Preterm AB Living   0 0 0 0 0 0   SAB TAB Ectopic Multiple Live Births   0 0 0 0 0       Home Medications    Prior to Admission medications   Medication Sig Start Date End Date Taking? Authorizing Provider  Albuterol Sulfate (PROAIR RESPICLICK) 108 (90 Base) MCG/ACT AEPB Inhale 2 puffs into the lungs every 4 (four) hours as needed. Patient not taking: Reported on 04/09/2016 11/28/15   Jomarie LongsBreeback, Jade L, PA-C  azithromycin (ZITHROMAX) 250 MG tablet Take 1 tablet (250 mg total) by mouth daily. Take first 2 tablets together, then 1 every day until finished. 05/29/17   Lurene ShadowPhelps, Anayeli Arel O, PA-C  benzonatate (TESSALON) 100 MG capsule Take 1-2 capsules (100-200 mg total) by mouth every 8 (eight)  hours. 05/29/17   Lurene ShadowPhelps, Ramandeep Arington O, PA-C  cetirizine-pseudoephedrine (ZYRTEC-D) 5-120 MG per tablet Take 1 tablet by mouth 2 (two) times daily. Patient not taking: Reported on 04/09/2016 12/06/13   Jomarie LongsBreeback, Jade L, PA-C  Levonorgestrel (KYLEENA IU) by Intrauterine route.    [provider]  lidocaine (XYLOCAINE) 2 % jelly Apply 1 application topically as needed. 04/09/16   Dove, Hollie SalkMyra C, MD  lidocaine (XYLOCAINE) 2 % jelly APPLY 1 APPLICATION TOPICALLY AS NEEDED. 03/16/17   Allie Bossierove, Myra C, MD  PROAIR RESPICLICK 108 (90 Base) MCG/ACT AEPB INHALE 2 PUFFS INTO THE LUNGS EVERY 4 (FOUR) HOURS AS NEEDED. 10/13/16   Jomarie LongsBreeback, Jade L, PA-C    Family History Family History  Problem Relation Age of Onset  . Diabetes Maternal Grandfather   . Diabetes Paternal Grandmother   . Cancer Paternal Grandfather     Social History Social History   Tobacco Use  . Smoking status: Never Smoker  . Smokeless tobacco: Never Used  Substance Use Topics  . Alcohol use: Yes    Comment: mod  . Drug use: No     Allergies   Septra [sulfamethoxazole-trimethoprim] and Sulfonamide derivatives   Review of Systems Review of Systems  Constitutional: Positive for fever. Negative for chills.  HENT: Positive for congestion, ear pain (bilateral pressure),  postnasal drip and sore throat (minimal). Negative for trouble swallowing and voice change.   Respiratory: Positive for cough, chest tightness and wheezing. Negative for shortness of breath.   Cardiovascular: Negative for chest pain and palpitations.  Gastrointestinal: Negative for abdominal pain, diarrhea, nausea and vomiting.  Musculoskeletal: Negative for arthralgias, back pain and myalgias.  Skin: Negative for rash.     Physical Exam Triage Vital Signs ED Triage Vitals  Enc Vitals Group     BP 05/29/17 1052 132/81     Pulse Rate 05/29/17 1052 97     Resp --      Temp 05/29/17 1052 97.9 F (36.6 C)     Temp Source 05/29/17 1052 Oral     SpO2 05/29/17  1052 97 %     Weight 05/29/17 1053 180 lb (81.6 kg)     Height 05/29/17 1053 5\' 2"  (1.575 m)     Head Circumference --      Peak Flow --      Pain Score 05/29/17 1053 0     Pain Loc --      Pain Edu? --      Excl. in GC? --    No data found.  Updated Vital Signs BP 132/81 (BP Location: Right Arm)   Pulse 97   Temp 97.9 F (36.6 C) (Oral)   Ht 5\' 2"  (1.575 m)   Wt 180 lb (81.6 kg)   SpO2 97%   BMI 32.92 kg/m      Physical Exam  Constitutional: She is oriented to person, place, and time. She appears well-developed and well-nourished. No distress.  HENT:  Head: Normocephalic and atraumatic.  Right Ear: Tympanic membrane normal.  Left Ear: Tympanic membrane normal.  Nose: Nose normal. Right sinus exhibits no maxillary sinus tenderness and no frontal sinus tenderness. Left sinus exhibits no maxillary sinus tenderness and no frontal sinus tenderness.  Mouth/Throat: Uvula is midline and mucous membranes are normal. Posterior oropharyngeal erythema present. No oropharyngeal exudate, posterior oropharyngeal edema or tonsillar abscesses.  Eyes: EOM are normal.  Neck: Normal range of motion. Neck supple.  Cardiovascular: Normal rate and regular rhythm.  Pulmonary/Chest: Effort normal. No stridor. No respiratory distress. She has wheezes ( bibasilar). She has rales ( Right lower lung field).  Musculoskeletal: Normal range of motion.  Neurological: She is alert and oriented to person, place, and time.  Skin: Skin is warm and dry. She is not diaphoretic.  Psychiatric: She has a normal mood and affect. Her behavior is normal.  Nursing note and vitals reviewed.    UC Treatments / Results  Labs (all labs ordered are listed, but only abnormal results are displayed) Labs Reviewed - No data to display  EKG  EKG Interpretation None       Radiology No results found.  Procedures Procedures (including critical care time)  Medications Ordered in UC Medications    methylPREDNISolone acetate (DEPO-MEDROL) injection 80 mg (80 mg Intramuscular Given 05/29/17 1110)     Initial Impression / Assessment and Plan / UC Course  I have reviewed the triage vital signs and the nursing notes.  Pertinent labs & imaging results that were available during my care of the patient were reviewed by me and considered in my medical decision making (see chart for details).     Hx and exam c/w asthma exacerbation, likely due to viral illness. Depo-medrol 80mg  IM given in UC Pt declined duoneb.  Will prescribe azithromycin with expiration date for pt to fill if fever  persists for 3 days or symptoms of chest tightness, SOB, n/v, loss of appetite develop due to concern for potential early Right lower lobe pneumonia. Patient verbalized understanding and agreement with treatment plan.   Final Clinical Impressions(s) / UC Diagnoses   Final diagnoses:  Upper respiratory tract infection, unspecified type  Mild intermittent reactive airway disease with wheezing with acute exacerbation    ED Discharge Orders        Ordered    benzonatate (TESSALON) 100 MG capsule  Every 8 hours     05/29/17 1109    azithromycin (ZITHROMAX) 250 MG tablet  Daily    Comments:  Void after 06/11/17   05/29/17 1109       Controlled Substance Prescriptions [place] Controlled Substance Registry consulted? Not Applicable   Rolla Plate 05/29/17 1116

## 2017-05-29 NOTE — ED Triage Notes (Signed)
Pt started feeling ill Wednesday night, yesterday had a fever of 100.  Sore throat, congestion, and cough.  Has hx of asthma. Had some wheezing last night, and used her inhaler.

## 2017-05-29 NOTE — Discharge Instructions (Signed)
°  Your symptoms are likely due to a virus such as the common cold, however, if you developing worsening chest congestion with shortness of breath, persistent fever (>100.4*F) for 3 days, or symptoms not improving in 4-5 days, you may fill the antibiotic (azithromycin).  If you do fill the antibiotic,  please take antibiotics as prescribed and be sure to complete entire course even if you start to feel better to ensure infection does not come back. ° °You may take 500mg acetaminophen every 4-6 hours or in combination with ibuprofen 400-600mg every 6-8 hours as needed for pain, inflammation, and fever. ° °Be sure to drink at least eight 8oz glasses of water to stay well hydrated and get at least 8 hours of sleep at night, preferably more while sick.  ° ° °

## 2017-09-30 ENCOUNTER — Emergency Department (INDEPENDENT_AMBULATORY_CARE_PROVIDER_SITE_OTHER)
Admission: EM | Admit: 2017-09-30 | Discharge: 2017-09-30 | Disposition: A | Discharge status: Home / Self Care | Payer: 59 | Source: Home / Self Care | Attending: Family Medicine | Admitting: Family Medicine

## 2017-09-30 ENCOUNTER — Encounter: Payer: Self-pay | Admitting: *Deleted

## 2017-09-30 ENCOUNTER — Other Ambulatory Visit: Payer: Self-pay

## 2017-09-30 DIAGNOSIS — B9789 Other viral agents as the cause of diseases classified elsewhere: Secondary | ICD-10-CM | POA: Diagnosis not present

## 2017-09-30 DIAGNOSIS — J029 Acute pharyngitis, unspecified: Secondary | ICD-10-CM | POA: Diagnosis not present

## 2017-09-30 DIAGNOSIS — J4521 Mild intermittent asthma with (acute) exacerbation: Secondary | ICD-10-CM | POA: Diagnosis not present

## 2017-09-30 DIAGNOSIS — J069 Acute upper respiratory infection, unspecified: Secondary | ICD-10-CM

## 2017-09-30 LAB — POCT RAPID STREP A (OFFICE): Rapid Strep A Screen: NEGATIVE

## 2017-09-30 MED ORDER — METHYLPREDNISOLONE ACETATE 80 MG/ML IJ SUSP
80.0000 mg | Freq: Once | INTRAMUSCULAR | Status: AC
Start: 2017-09-30 — End: 2017-09-30
  Administered 2017-09-30: 80 mg via INTRAMUSCULAR

## 2017-09-30 MED ORDER — PREDNISONE 20 MG PO TABS: ORAL_TABLET | ORAL | 0 refills | Status: DC

## 2017-09-30 NOTE — Discharge Instructions (Signed)
°  You may take 500mg  acetaminophen every 4-6 hours or in combination with ibuprofen 400-600mg  every 6-8 hours as needed for pain, inflammation, and fever.  Be sure to drink at least eight 8oz glasses of water to stay well hydrated and get at least 8 hours of sleep at night, preferably more while sick.   You were given a shot of solumedrol (a steroid) today to help with inflammation in your lungs to help you breath better and to help with your cough.  You have been prescribed 5 days of prednisone, an oral steroid.  You may start this medication tomorrow with breakfast.    Please follow up with family medicine in 1 week if not improving.

## 2017-09-30 NOTE — ED Provider Notes (Signed)
[facility]    CSN: [id number] Arrival date & time: 09/30/17  1031     History   Chief Complaint Chief Complaint  Patient presents with  . Sore Throat  . Nasal Congestion    HPI Anita Baird is a 27 y.o. female.   HPI  Anita Baird is a 27 y.o. female presenting to UC with c/o fever Tmax 101*F 2 days ago and 3 days of a sore throat with nasal congestion; cough started today with mild SOB and chest tightness. She has needed to use her inhaler more often since symptoms started. Denies fever since the first day. Denies n/v/d. No known sick contacts.  She is requesting a shot of steroids, she typically does well with this.    Past Medical History:  Diagnosis Date  . Asthma   . Depression     Patient Active Problem List   Diagnosis Date Noted  . Dyspareunia in female 02/20/2016  . Encounter for female birth control 11/28/2015  . Frequent headaches 11/28/2015  . Acute maxillary sinusitis 12/12/2013  . Menorrhagia with irregular cycle 12/06/2013  . Extrinsic asthma 12/06/2013  . Seasonal allergies 12/06/2013    Past Surgical History:  Procedure Laterality Date  . TONSILLECTOMY      OB History    Gravida  0   Para  0   Term  0   Preterm  0   AB  0   Living  0     SAB  0   TAB  0   Ectopic  0   Multiple  0   Live Births  0            Home Medications    Prior to Admission medications   Medication Sig Start Date End Date Taking? Authorizing Provider  Albuterol Sulfate (PROAIR RESPICLICK) 108 (90 Base) MCG/ACT AEPB Inhale 2 puffs into the lungs every 4 (four) hours as needed. Patient not taking: Reported on 04/09/2016 11/28/15   Jomarie Longs, PA-C  cetirizine-pseudoephedrine (ZYRTEC-D) 5-120 MG per tablet Take 1 tablet by mouth 2 (two) times daily. Patient not taking: Reported on 04/09/2016 12/06/13   Jomarie Longs, PA-C  Levonorgestrel (KYLEENA IU) by Intrauterine route.    [provider]  lidocaine (XYLOCAINE) 2  % jelly APPLY 1 APPLICATION TOPICALLY AS NEEDED. 03/16/17   Nicholaus Bloom C, MD  predniSONE (DELTASONE) 20 MG tablet 3 tabs po day one, then 2 po daily x 4 days 09/30/17   Lurene Shadow, PA-C  PROAIR RESPICLICK 108 (90 Base) MCG/ACT AEPB INHALE 2 PUFFS INTO THE LUNGS EVERY 4 (FOUR) HOURS AS NEEDED. 10/13/16   Jomarie Longs, PA-C    Family History Family History  Problem Relation Age of Onset  . Diabetes Maternal Grandfather   . Diabetes Paternal Grandmother   . Cancer Paternal Grandfather     Social History Social History   Tobacco Use  . Smoking status: Never Smoker  . Smokeless tobacco: Never Used  Substance Use Topics  . Alcohol use: Yes    Comment: mod  . Drug use: No     Allergies   Septra [sulfamethoxazole-trimethoprim] and Sulfonamide derivatives   Review of Systems Review of Systems  Constitutional: Positive for fever. Negative for chills.  HENT: Positive for congestion and sore throat. Negative for ear pain, trouble swallowing and voice change.   Respiratory: Positive for cough, chest tightness, shortness of breath and wheezing.   Cardiovascular: Negative for chest pain and palpitations.  Gastrointestinal:  Negative for abdominal pain, diarrhea, nausea and vomiting.  Musculoskeletal: Negative for arthralgias, back pain and myalgias.  Skin: Negative for rash.     Physical Exam Triage Vital Signs ED Triage Vitals  Enc Vitals Group     BP 09/30/17 1048 126/83     Pulse Rate 09/30/17 1048 95     Resp 09/30/17 1048 14     Temp 09/30/17 1048 (!) 97.5 F (36.4 C)     Temp Source 09/30/17 1048 Oral     SpO2 09/30/17 1048 100 %     Weight 09/30/17 1049 184 lb (83.5 kg)     Height --      Head Circumference --      Peak Flow --      Pain Score 09/30/17 1048 6     Pain Loc --      Pain Edu? --      Excl. in GC? --    No data found.  Updated Vital Signs BP 126/83 (BP Location: Right Arm)   Pulse 95   Temp (!) 97.5 F (36.4 C) (Oral)   Resp 14   Wt 184 lb  (83.5 kg)   LMP 09/21/2017   SpO2 100%   BMI 33.65 kg/m   Visual Acuity Right Eye Distance:   Left Eye Distance:   Bilateral Distance:    Right Eye Near:   Left Eye Near:    Bilateral Near:     Physical Exam  Constitutional: She is oriented to person, place, and time. She appears well-developed and well-nourished.  Non-toxic appearance. She does not appear ill. No distress.  HENT:  Head: Normocephalic and atraumatic.  Right Ear: Tympanic membrane normal.  Left Ear: Tympanic membrane normal.  Nose: Nose normal. Right sinus exhibits no maxillary sinus tenderness and no frontal sinus tenderness. Left sinus exhibits no maxillary sinus tenderness and no frontal sinus tenderness.  Mouth/Throat: Uvula is midline and mucous membranes are normal. Posterior oropharyngeal erythema present. No oropharyngeal exudate, posterior oropharyngeal edema or tonsillar abscesses.  Eyes: EOM are normal.  Neck: Normal range of motion. Neck supple.  Cardiovascular: Normal rate and regular rhythm.  Pulmonary/Chest: Effort normal and breath sounds normal. No stridor. No respiratory distress. She has no wheezes. She has no rhonchi.  Musculoskeletal: Normal range of motion.  Lymphadenopathy:    She has no cervical adenopathy.  Neurological: She is alert and oriented to person, place, and time.  Skin: Skin is warm and dry.  Psychiatric: She has a normal mood and affect. Her behavior is normal.  Nursing note and vitals reviewed.    UC Treatments / Results  Labs (all labs ordered are listed, but only abnormal results are displayed) Labs Reviewed  STREP A DNA PROBE  POCT RAPID STREP A (OFFICE)    EKG None  Radiology No results found.  Procedures Procedures (including critical care time)  Medications Ordered in UC Medications  methylPREDNISolone acetate (DEPO-MEDROL) injection 80 mg (80 mg Intramuscular Given 09/30/17 1100)    Initial Impression / Assessment and Plan / UC Course  I have  reviewed the triage vital signs and the nursing notes.  Pertinent labs & imaging results that were available during my care of the patient were reviewed by me and considered in my medical decision making (see chart for details).     Lungs: CTAB O2 Sat 100% on RA Rapid strep performed in triage: NEGATIVE Symptoms appear viral in nature. Home care instructions provided.  Final Clinical Impressions(s) / UC Diagnoses  Final diagnoses:  Viral URI with cough  Mild intermittent asthma with exacerbation  Sore throat     Discharge Instructions      You may take 500mg  acetaminophen every 4-6 hours or in combination with ibuprofen 400-600mg  every 6-8 hours as needed for pain, inflammation, and fever.  Be sure to drink at least eight 8oz glasses of water to stay well hydrated and get at least 8 hours of sleep at night, preferably more while sick.   You were given a shot of solumedrol (a steroid) today to help with inflammation in your lungs to help you breath better and to help with your cough.  You have been prescribed 5 days of prednisone, an oral steroid.  You may start this medication tomorrow with breakfast.    Please follow up with family medicine in 1 week if not improving.     ED Prescriptions    Medication Sig Dispense Auth. Provider   predniSONE (DELTASONE) 20 MG tablet 3 tabs po day one, then 2 po daily x 4 days 11 tablet Lurene ShadowPhelps, Kynzley Dowson O, PA-C     Controlled Substance Prescriptions [place] Controlled Substance Registry consulted? Not Applicable   Rolla Platehelps, Tallie Hevia O, PA-C 09/30/17 1334

## 2017-09-30 NOTE — ED Triage Notes (Signed)
Patient c/o fever for 2 days 3 days ago with sore throat and nasal congestion. Minimal cough started today. Also has h/o asthma which she reports has flared up.

## 2017-10-01 ENCOUNTER — Telehealth: Payer: Self-pay | Admitting: Emergency Medicine

## 2017-10-01 LAB — STREP A DNA PROBE: Group A Strep Probe: NOT DETECTED

## 2017-11-09 ENCOUNTER — Emergency Department (INDEPENDENT_AMBULATORY_CARE_PROVIDER_SITE_OTHER)
Admission: EM | Admit: 2017-11-09 | Discharge: 2017-11-09 | Disposition: A | Discharge status: Home / Self Care | Payer: 59 | Source: Home / Self Care

## 2017-11-09 ENCOUNTER — Other Ambulatory Visit: Payer: Self-pay

## 2017-11-09 ENCOUNTER — Encounter: Payer: Self-pay | Admitting: Emergency Medicine

## 2017-11-09 DIAGNOSIS — N390 Urinary tract infection, site not specified: Secondary | ICD-10-CM

## 2017-11-09 DIAGNOSIS — R300 Dysuria: Secondary | ICD-10-CM

## 2017-11-09 LAB — POCT URINALYSIS DIP (MANUAL ENTRY)
BILIRUBIN UA: NEGATIVE
GLUCOSE UA: NEGATIVE mg/dL
Ketones, POC UA: NEGATIVE mg/dL
Nitrite, UA: NEGATIVE
PH UA: 6.5 (ref 5.0–8.0)
Protein Ur, POC: NEGATIVE mg/dL
RBC UA: NEGATIVE
SPEC GRAV UA: 1.01 (ref 1.010–1.025)
UROBILINOGEN UA: 0.2 U/dL

## 2017-11-09 LAB — POCT URINE PREGNANCY: Preg Test, Ur: NEGATIVE

## 2017-11-09 MED ORDER — AMOXICILLIN 500 MG PO CAPS: 500.0000 mg | ORAL_CAPSULE | Freq: Three times a day (TID) | ORAL | 0 refills | Status: DC

## 2017-11-09 NOTE — ED Triage Notes (Signed)
27 y.o female c/o of dysuria and frequency x5 days. States the Azo is not giving any relief.

## 2017-11-09 NOTE — Discharge Instructions (Signed)
Return if any problems.

## 2017-11-10 NOTE — ED Provider Notes (Signed)
[facility]    CSN: [id number] Arrival date & time: 11/09/17  1026     History   Chief Complaint Chief Complaint  Patient presents with  . Dysuria    HPI Dannielle Baskins is a 27 y.o. female.   The history is provided by the patient. No language interpreter was used.  Dysuria  Pain quality:  Aching Pain severity:  Mild Onset quality:  Gradual Duration:  5 days Timing:  Constant Progression:  Worsening Chronicity:  New Recent urinary tract infections: no   Relieved by:  Nothing Worsened by:  Nothing Ineffective treatments:  None tried Associated symptoms: fever   Risk factors: no sexually transmitted infections     Past Medical History:  Diagnosis Date  . Asthma   . Depression     Patient Active Problem List   Diagnosis Date Noted  . Dyspareunia in female 02/20/2016  . Encounter for female birth control 11/28/2015  . Frequent headaches 11/28/2015  . Acute maxillary sinusitis 12/12/2013  . Menorrhagia with irregular cycle 12/06/2013  . Extrinsic asthma 12/06/2013  . Seasonal allergies 12/06/2013    Past Surgical History:  Procedure Laterality Date  . TONSILLECTOMY      OB History    Gravida  0   Para  0   Term  0   Preterm  0   AB  0   Living  0     SAB  0   TAB  0   Ectopic  0   Multiple  0   Live Births  0            Home Medications    Prior to Admission medications   Medication Sig Start Date End Date Taking? Authorizing Provider  amoxicillin (AMOXIL) 500 MG capsule Take 1 capsule (500 mg total) by mouth 3 (three) times daily. 11/09/17   Elson Areas, PA-C  Levonorgestrel (KYLEENA IU) by Intrauterine route.    [provider]  lidocaine (XYLOCAINE) 2 % jelly APPLY 1 APPLICATION TOPICALLY AS NEEDED. 03/16/17   Nicholaus Bloom C, MD  predniSONE (DELTASONE) 20 MG tablet 3 tabs po day one, then 2 po daily x 4 days 09/30/17   Lurene Shadow, PA-C  PROAIR RESPICLICK 108 (90 Base) MCG/ACT AEPB INHALE 2 PUFFS INTO  THE LUNGS EVERY 4 (FOUR) HOURS AS NEEDED. 10/13/16   Jomarie Longs, PA-C    Family History Family History  Problem Relation Age of Onset  . Diabetes Maternal Grandfather   . Diabetes Paternal Grandmother   . Cancer Paternal Grandfather     Social History Social History   Tobacco Use  . Smoking status: Never Smoker  . Smokeless tobacco: Never Used  Substance Use Topics  . Alcohol use: Yes    Comment: mod  . Drug use: No     Allergies   Septra [sulfamethoxazole-trimethoprim] and Sulfonamide derivatives   Review of Systems Review of Systems  Constitutional: Positive for fever.  Genitourinary: Positive for dysuria.  All other systems reviewed and are negative.    Physical Exam Triage Vital Signs ED Triage Vitals [11/09/17 1049]  Enc Vitals Group     BP 135/85     Pulse Rate 72     Resp      Temp 97.8 F (36.6 C)     Temp Source Oral     SpO2 100 %     Weight 190 lb (86.2 kg)     Height 5\' 2"  (1.575 m)  Head Circumference      Peak Flow      Pain Score 6     Pain Loc      Pain Edu?      Excl. in GC?    No data found.  Updated Vital Signs BP 135/85 (BP Location: Right Arm)   Pulse 72   Temp 97.8 F (36.6 C) (Oral)   Ht 5\' 2"  (1.575 m)   Wt 190 lb (86.2 kg)   LMP 10/11/2017 (Exact Date)   SpO2 100%   BMI 34.75 kg/m   Visual Acuity Right Eye Distance:   Left Eye Distance:   Bilateral Distance:    Right Eye Near:   Left Eye Near:    Bilateral Near:     Physical Exam  Constitutional: She is oriented to person, place, and time. She appears well-developed and well-nourished.  HENT:  Head: Normocephalic.  Right Ear: External ear normal.  Left Ear: External ear normal.  Mouth/Throat: Oropharynx is clear and moist.  Eyes: Pupils are equal, round, and reactive to light. EOM are normal.  Neck: Normal range of motion.  Cardiovascular: Normal rate.  Pulmonary/Chest: Effort normal.  Abdominal: She exhibits no distension.  Musculoskeletal:  Normal range of motion.  Neurological: She is alert and oriented to person, place, and time.  Psychiatric: She has a normal mood and affect.  Nursing note and vitals reviewed.    UC Treatments / Results  Labs (all labs ordered are listed, but only abnormal results are displayed) Labs Reviewed  POCT URINALYSIS DIP (MANUAL ENTRY) - Abnormal; Notable for the following components:      Result Value   Leukocytes, UA Small (1+) (*)    All other components within normal limits  URINE CULTURE  POCT URINE PREGNANCY    EKG None  Radiology No results found.  Procedures Procedures (including critical care time)  Medications Ordered in UC Medications - No data to display  Initial Impression / Assessment and Plan / UC Course  I have reviewed the triage vital signs and the nursing notes.  Pertinent labs & imaging results that were available during my care of the patient were reviewed by me and considered in my medical decision making (see chart for details).      Final Clinical Impressions(s) / UC Diagnoses   Final diagnoses:  Dysuria  Urinary tract infection without hematuria, site unspecified     Discharge Instructions     Return if any problems.   ED Prescriptions    Medication Sig Dispense Auth. Provider   amoxicillin (AMOXIL) 500 MG capsule Take 1 capsule (500 mg total) by mouth 3 (three) times daily. 30 capsule Elson AreasSofia, Leslie K, [place]PA-C     Controlled Substance Prescriptions [place] Controlled Substance Registry consulted? Not Applicable  An After Visit Summary was printed and given to the patient.   Elson AreasSofia, Leslie K, [place]-C 11/10/17 [phone]

## 2017-11-11 ENCOUNTER — Telehealth: Payer: Self-pay | Admitting: Emergency Medicine

## 2017-11-11 LAB — URINE CULTURE
MICRO NUMBER:: 90893358
SPECIMEN QUALITY:: ADEQUATE

## 2017-12-16 ENCOUNTER — Encounter: Payer: Self-pay | Admitting: Physician Assistant

## 2017-12-16 ENCOUNTER — Other Ambulatory Visit: Payer: Self-pay | Admitting: Physician Assistant

## 2017-12-16 DIAGNOSIS — F329 Major depressive disorder, single episode, unspecified: Secondary | ICD-10-CM

## 2017-12-16 DIAGNOSIS — F32A Depression, unspecified: Secondary | ICD-10-CM

## 2017-12-16 DIAGNOSIS — F419 Anxiety disorder, unspecified: Principal | ICD-10-CM

## 2017-12-23 ENCOUNTER — Encounter: Payer: Self-pay | Admitting: Physician Assistant

## 2017-12-23 ENCOUNTER — Ambulatory Visit (INDEPENDENT_AMBULATORY_CARE_PROVIDER_SITE_OTHER): Payer: 59 | Admitting: Physician Assistant

## 2017-12-23 VITALS — BP 119/77 | HR 72 | Ht 62.0 in | Wt 191.0 lb

## 2017-12-23 DIAGNOSIS — F331 Major depressive disorder, recurrent, moderate: Secondary | ICD-10-CM | POA: Insufficient documentation

## 2017-12-23 DIAGNOSIS — F419 Anxiety disorder, unspecified: Secondary | ICD-10-CM | POA: Diagnosis not present

## 2017-12-23 DIAGNOSIS — Z23 Encounter for immunization: Secondary | ICD-10-CM

## 2017-12-23 MED ORDER — VILAZODONE HCL 20 MG PO TABS: ORAL_TABLET | ORAL | 1 refills | Status: DC

## 2017-12-23 NOTE — Progress Notes (Signed)
Subjective:    Patient ID: Quinci Perret, female    DOB: 1991-02-24, 27 y.o.   MRN: [id number]  HPI  Patient is a 27 year old female who presents to the clinic to talk about anxiety and depression.  She has struggled with anxiety and depression since she was 27 years old and started on medication.  Medication has helped significantly in the past but she is tried to get off of it due to side effects of medications such as weight gain.  She tried Prozac initially with no benefit.  Zoloft helped a lot she started noticing weight gain.  She did stay on Wellbutrin but she did not feel like she gave it a good trial.  She denies any situations that are overtly causing her to be down and depressed.  She is walking regularly due to having a dog.  She has lots of family history of mental issues.  She has no formal diagnosis is to note from.  She denies any plans of suicide but she does have days where she feels off she would be better off dead.  She denies any mania or hypomania symptoms.pt is sleeping well.   She has been to counseling in the past with little benefit.   .. Active Ambulatory Problems    Diagnosis Date Noted  . Menorrhagia with irregular cycle 12/06/2013  . Extrinsic asthma 12/06/2013  . Seasonal allergies 12/06/2013  . Acute maxillary sinusitis 12/12/2013  . Encounter for female birth control 11/28/2015  . Frequent headaches 11/28/2015  . Dyspareunia in female 02/20/2016  . Anxiety 12/23/2017  . Moderate episode of recurrent major depressive disorder (HCC) 12/23/2017   Resolved Ambulatory Problems    Diagnosis Date Noted  . No Resolved Ambulatory Problems   Past Medical History:  Diagnosis Date  . Asthma   . Depression         Review of Systems  All other systems reviewed and are negative.      Objective:   Physical Exam  Constitutional: She is oriented to person, place, and time. She appears well-developed and well-nourished.  HENT:  Head: Normocephalic and  atraumatic.  Right Ear: External ear normal.  Left Ear: External ear normal.  Cardiovascular: Normal rate and regular rhythm.  Pulmonary/Chest: Effort normal and breath sounds normal.  Neurological: She is alert and oriented to person, place, and time.  Psychiatric: She has a normal mood and affect. Her behavior is normal.          Assessment & Plan:  Marland KitchenMarland KitchenDiagnoses and all orders for this visit:  Moderate episode of recurrent major depressive disorder (HCC) -     Vilazodone HCl (VIIBRYD) 20 MG TABS; Take one half tablet for 7 days then increase to 1 full tablet daily.  Need for immunization against influenza -     Flu Vaccine QUAD 36+ mos IM  Anxiety   .Marland Kitchen Depression screen PHQ 2/9 12/23/2017  Decreased Interest 2  Down, Depressed, Hopeless 3  PHQ - 2 Score 5  Altered sleeping 2  Tired, decreased energy 3  Change in appetite 2  Feeling bad or failure about yourself  3  Trouble concentrating 1  Moving slowly or fidgety/restless 0  Suicidal thoughts 1  PHQ-9 Score 17  Difficult doing work/chores Somewhat difficult   .Marland Kitchen GAD 7 : Generalized Anxiety Score 12/23/2017  Nervous, Anxious, on Edge 2  Control/stop worrying 2  Worry too much - different things 1  Trouble relaxing 1  Restless 1  Easily annoyed or irritable  3  Afraid - awful might happen 1  Total GAD 7 Score 11  Anxiety Difficulty Somewhat difficult   Will start viibryd. Discuss it is weight neutral. Follow up in 6-8 weeks.follow up as needed. Continue to work on exercise and ways to reduce stress.    Marland Kitchen.Spent 30 minutes with patient and greater than 50 percent of visit spent counseling patient regarding treatment plan and CBT ways to reduce depression and anxiety.

## 2018-01-13 ENCOUNTER — Telehealth: Payer: Self-pay | Admitting: Physician Assistant

## 2018-01-13 ENCOUNTER — Encounter: Payer: Self-pay | Admitting: Physician Assistant

## 2018-01-13 NOTE — Telephone Encounter (Signed)
Received fax from [employer] that Viibryd requires a PA. Information has been sent to the insurance company. Awaiting determination.   

## 2018-01-14 NOTE — Telephone Encounter (Signed)
Received fax from insurance that Viibryd has been approved from 01/13/2018 through 01/13/2020. Pharmacy aware. Form sent to scan. -hsm.

## 2018-01-28 ENCOUNTER — Encounter: Payer: Self-pay | Admitting: Physician Assistant

## 2018-02-08 ENCOUNTER — Telehealth: Payer: Self-pay

## 2018-02-08 NOTE — Telephone Encounter (Signed)
Anita Baird called today saying that at her last visit you prescribed Viibryd, and you said for her to call if she has any side effects. She said she has stopped sleeping while taking the medication, and was wanting to see if there is any other alternative? Thanks!

## 2018-02-09 NOTE — Telephone Encounter (Signed)
Did you get any benefit on the 10mg (cutting the tablet in half). If so did you have sleep issues with 10mg ?

## 2018-02-18 ENCOUNTER — Other Ambulatory Visit: Payer: Self-pay

## 2018-02-18 ENCOUNTER — Ambulatory Visit (INDEPENDENT_AMBULATORY_CARE_PROVIDER_SITE_OTHER): Payer: 59 | Admitting: Psychiatry

## 2018-02-18 ENCOUNTER — Encounter (HOSPITAL_COMMUNITY): Payer: Self-pay | Admitting: Psychiatry

## 2018-02-18 VITALS — BP 110/80 | HR 73 | Ht 63.0 in | Wt 189.0 lb

## 2018-02-18 DIAGNOSIS — F411 Generalized anxiety disorder: Secondary | ICD-10-CM

## 2018-02-18 DIAGNOSIS — F331 Major depressive disorder, recurrent, moderate: Secondary | ICD-10-CM

## 2018-02-18 DIAGNOSIS — F5102 Adjustment insomnia: Secondary | ICD-10-CM

## 2018-02-18 MED ORDER — ESCITALOPRAM OXALATE 10 MG PO TABS: 10.0000 mg | ORAL_TABLET | Freq: Every day | ORAL | 1 refills | Status: DC

## 2018-02-18 MED ORDER — TRAZODONE HCL 50 MG PO TABS: 50.0000 mg | ORAL_TABLET | Freq: Every day | ORAL | 0 refills | Status: DC

## 2018-02-18 NOTE — Progress Notes (Signed)
Psychiatric Initial Adult Assessment   Patient Identification: Anita Baird MRN:  [id number] Date of Evaluation:  02/18/2018 Referral Source: Primary care Chief Complaint:   Chief Complaint    Establish Care     Visit Diagnosis:    ICD-10-CM   1. Major depressive disorder, recurrent episode, moderate (HCC) F33.1   2. GAD (generalized anxiety disorder) F41.1   3. Adjustment insomnia F51.02     History of Present Illness: 27 years old currently living with her boyfriend white female graduated from [employer].  Referred for management of depression she has taken Zoloft in the past that caused weight gain Wellbutrin did not help.  That happened around age 60 when she was diagnosed with depression.  Recently for the last 8 to 9 months has been feeling low on and off says she suffers from chronic depression but over the last few weeks more down decreased energy fatigue decreased interest in things at times crying spells poor sleep.  She was started [place] but that made her sleep more so she stopped it.  She worries about finances she was about looking for a job also some relationship concerns at times but overall relationship is fine  Does not endorse manic symptoms or psychotic symptoms Uses 2 or 3 drinks per week no recent use of marijuana  Denies any past trauma  She feels subdued and withdrawn and want to try some different medication also consider therapy  Modifying factors; her boyfriend pets Aggravating factors; looking for job      Associated Signs/Symptoms: Depression Symptoms:  depressed mood, difficulty concentrating, anxiety, loss of energy/fatigue, disturbed sleep, (Hypo) Manic Symptoms:  Distractibility, Anxiety Symptoms:  Excessive Worry, Psychotic Symptoms:  denies PTSD Symptoms: NA  Past Psychiatric History: depression  Previous Psychotropic Medications: Yes   Substance Abuse History in the last 12 months:  Yes.    Consequences of Substance  Abuse: NA  Past Medical History:  Past Medical History:  Diagnosis Date  . Asthma   . Depression     Past Surgical History:  Procedure Laterality Date  . TONSILLECTOMY      Family Psychiatric History: depression Mom; dad; siblings: uncle:  depression  Family History:  Family History  Problem Relation Age of Onset  . Diabetes Maternal Grandfather   . Diabetes Paternal Grandmother   . Cancer Paternal Grandfather     Social History:   Social History   Socioeconomic History  . Marital status: Single    Spouse name: Not on file  . Number of children: Not on file  . Years of education: Not on file  . Highest education level: Not on file  Occupational History  . Not on file  Social Needs  . Financial resource strain: Not on file  . Food insecurity:    Worry: Not on file    Inability: Not on file  . Transportation needs:    Medical: Not on file    Non-medical: Not on file  Tobacco Use  . Smoking status: Never Smoker  . Smokeless tobacco: Never Used  Substance and Sexual Activity  . Alcohol use: Yes    Comment: 1-2 times weekly, wine or beer  . Drug use: Yes    Types: Marijuana    Comment: last used in September 2019  . Sexual activity: Yes    Birth control/protection: IUD  Lifestyle  . Physical activity:    Days per week: Not on file    Minutes per session: Not on file  . Stress: Not on  file  Relationships  . Social connections:    Talks on phone: Not on file    Gets together: Not on file    Attends religious service: Not on file    Active member of club or organization: Not on file    Attends meetings of clubs or organizations: Not on file    Relationship status: Not on file  Other Topics Concern  . Not on file  Social History Narrative  . Not on file    Additional Social History: Grew up with parents growing was good no trauma.  Had a few friends says that she was bullied in high school through social media and but in general she did have a few  friends she graduated high school and also has an Neurosurgeon.    Allergies:   Allergies  Allergen Reactions  . Septra [Sulfamethoxazole-Trimethoprim]   . Sulfonamide Derivatives     REACTION: hives    Metabolic Disorder Labs: No results found for: HGBA1C, MPG Lab Results  Component Value Date   PROLACTIN 10.4 11/22/2012   No results found for: CHOL, TRIG, HDL, CHOLHDL, VLDL, LDLCALC   Current Medications: Current Outpatient Medications  Medication Sig Dispense Refill  . Levonorgestrel (KYLEENA IU) by Intrauterine route.    Marland Kitchen escitalopram (LEXAPRO) 10 MG tablet Take 1 tablet (10 mg total) by mouth daily. 30 tablet 1  . lidocaine (XYLOCAINE) 2 % jelly APPLY 1 APPLICATION TOPICALLY AS NEEDED. (Patient not taking: Reported on 02/18/2018) 15 mL 2  . PROAIR RESPICLICK 108 (90 Base) MCG/ACT AEPB INHALE 2 PUFFS INTO THE LUNGS EVERY 4 (FOUR) HOURS AS NEEDED. (Patient not taking: Reported on 02/18/2018) 1 each 3  . traZODone (DESYREL) 50 MG tablet Take 1 tablet (50 mg total) by mouth at bedtime. 30 tablet 0   No current facility-administered medications for this visit.     Neurologic: Headache: No Seizure: No Paresthesias:No  Musculoskeletal: Strength & Muscle Tone: within normal limits Gait & Station: normal Patient leans: no lean  Psychiatric Specialty Exam: Review of Systems  Cardiovascular: Negative for chest pain.  Musculoskeletal: Negative for back pain.  Skin: Negative for rash.  Neurological: Negative for tremors.  Psychiatric/Behavioral: Positive for depression.    Blood pressure 110/80, pulse 73, height 5\' 3"  (1.6 m), weight 189 lb (85.7 kg).Body mass index is 33.48 kg/m.  General Appearance: Casual  Eye Contact:  Fair  Speech:  Slow  Volume:  Decreased  Mood:  Dysphoric  Affect:  Congruent and Constricted  Thought Process:  Goal Directed  Orientation:  Full (Time, Place, and Person)  Thought Content:  Rumination  Suicidal Thoughts:  No  Homicidal Thoughts:   No  Memory:  Immediate;   Fair Recent;   Fair  Judgement:  Fair  Insight:  Present  Psychomotor Activity:  Decreased  Concentration:  Concentration: Fair and Attention Span: Fair  Recall:  [employer] of Knowledge:Good  Language: Good  Akathisia:  No  Handed:  Right  AIMS (if indicated):    Assets:  Desire for Improvement  ADL's:  Intact  Cognition: WNL  Sleep:  poor    Treatment Plan Summary: Medication management and Plan as follows  MDD moderate, recurrent: start lexapro increase to 10mg  in one week. Explained side effects Drink water and have fruits. 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 Start trazadone small dose of 25-50mg  for now  More than 50% time spent in counseling and coordination of  care including patient education reviewed side effects and concerns were addressed  Refer to therapy also for coping skills depression FU 3-4 w or earlier if needed  Thresa Ross, MD 11/7/20199:26 AM

## 2018-02-19 NOTE — Telephone Encounter (Signed)
Called and left a voicemail for patient to call back to obtain information. Call back information provided.

## 2018-03-12 ENCOUNTER — Other Ambulatory Visit (HOSPITAL_COMMUNITY): Payer: Self-pay | Admitting: Psychiatry

## 2018-03-15 ENCOUNTER — Other Ambulatory Visit (HOSPITAL_COMMUNITY): Payer: Self-pay | Admitting: Psychiatry

## 2018-03-16 ENCOUNTER — Encounter (HOSPITAL_COMMUNITY): Payer: Self-pay | Admitting: Licensed Clinical Social Worker

## 2018-03-16 ENCOUNTER — Ambulatory Visit (INDEPENDENT_AMBULATORY_CARE_PROVIDER_SITE_OTHER): Payer: 59 | Admitting: Licensed Clinical Social Worker

## 2018-03-16 DIAGNOSIS — F331 Major depressive disorder, recurrent, moderate: Secondary | ICD-10-CM | POA: Diagnosis not present

## 2018-03-16 DIAGNOSIS — F5102 Adjustment insomnia: Secondary | ICD-10-CM | POA: Diagnosis not present

## 2018-03-16 DIAGNOSIS — F411 Generalized anxiety disorder: Secondary | ICD-10-CM | POA: Diagnosis not present

## 2018-03-16 NOTE — Progress Notes (Signed)
Comprehensive Clinical Assessment (CCA) Note  03/16/2018 Anita Baird [id number]  Visit Diagnosis:      ICD-10-CM   1. Major depressive disorder, recurrent episode, moderate (HCC) F33.1   2. GAD (generalized anxiety disorder) F41.1   3. Adjustment insomnia F51.02       CCA Part One  Part One has been completed on paper by the patient.  (See scanned document in Chart Review)  CCA Part Two A  Intake/Chief Complaint:  CCA Intake With Chief Complaint CCA Part Two Date: 03/16/18 CCA Part Two Time: 0935 Chief Complaint/Presenting Problem: Patient reports "I've  been pretty stressed out for the past few months."   Patients Currently Reported Symptoms/Problems: Has been looking for a job and despite going to several interviews she hasn't been offered anything.  Excessive irritability at times.  Energy level has been low.  Lacking motivation to complete tasks.  Hard to concentrate because of racing thoughts.  Worrying can be excessive at times.   Individual's Strengths: Had trouble identifying strengths  Mom, sisters, and boyfriend are main sources of support.   Individual's Preferences: Wants to develop more effective coping skills for dealing with life stressors.  Not dwell on things so much.   Type of Services Patient Feels Are Needed: Medication management and maybe therapy Initial Clinical Notes/Concerns: Reports having a history of depression since adolescence.  Sought treatment about 10 years ago.  Took medication for many years. Saw a therapist briefly as a teenager, but she wasn't motivated to participate at the time.      Mental Health Symptoms Depression:  Depression: Worthlessness, Sleep (too much or little), Irritability, Hopelessness, Fatigue, Difficulty Concentrating  Mania:  Mania: N/A  Anxiety:   Anxiety: Worrying, Tension, Restlessness, Irritability, Fatigue, Difficulty concentrating, Sleep  Psychosis:  Psychosis: N/A  Trauma:  Trauma: N/A  Obsessions:  Obsessions: N/A   Compulsions:  Compulsions: N/A  Inattention:  Inattention: N/A  Hyperactivity/Impulsivity:  Hyperactivity/Impulsivity: N/A  Oppositional/Defiant Behaviors:  Oppositional/Defiant Behaviors: N/A  Borderline Personality:  Emotional Irregularity: N/A  Other Mood/Personality Symptoms:      Mental Status Exam Appearance and self-care  Stature:  Stature: Average  Weight:  Weight: Overweight  Clothing:  Clothing: Casual  Grooming:  Grooming: Normal  Cosmetic use:  Cosmetic Use: None  Posture/gait:  Posture/Gait: Normal  Motor activity:  Motor Activity: Not Remarkable  Sensorium  Attention:  Attention: Normal  Concentration:  Concentration: Normal  Orientation:  Orientation: X5  Recall/memory:  Recall/Memory: Normal  Affect and Mood  Affect:  Affect: Blunted  Mood:  Mood: Depressed, Anxious  Relating  Eye contact:  Eye Contact: Normal  Facial expression:  Facial Expression: Depressed  Attitude toward examiner:  Attitude Toward Examiner: Cooperative  Thought and Language  Speech flow: Speech Flow: Normal  Thought content:  Thought Content: Appropriate to mood and circumstances  Preoccupation:     Hallucinations:     Organization:     Company secretary of Knowledge:  Fund of Knowledge: Average  Intelligence:  Intelligence: Average  Abstraction:  Abstraction: Normal  Judgement:  Judgement: Normal  Reality Testing:  Reality Testing: Adequate  Insight:  Insight: Fair  Decision Making:  Decision Making: Normal  Social Functioning  Social Maturity:  Social Maturity: Responsible(Working on developing new friendships.)  Social Judgement:  Social Judgement: Normal  Stress  Stressors:  Stressors: Work, Transitions  Coping Ability:     Skill Deficits:     Supports:      Family and Psychosocial History: Family history Marital  status: Long term relationship Long term relationship, how long?: Over 2 years with Aiden Additional relationship information: They have known each  other for about 10 years.  They worked together as teens in a Physicist, medical.   Are you sexually active?: Yes Does patient have children?: No  Childhood History:  Childhood History By whom was/is the patient raised?: Both parents Additional childhood history information: Grew up in [place] Description of patient's relationship with caregiver when they were a child: Some conflict with mom as she was growing up.  Good relationship with dad Patient's description of current relationship with people who raised him/her: Close relationship with mom.  Not as close with her dad, but still a good relationship.   Does patient have siblings?: Yes Number of Siblings: 2 Description of patient's current relationship with siblings: Oldest sister, Yehuda Mao (33)-lives in [place]                      Older sister, Aundra Millet (30)-lives in [place]      Good relationship with both-they talk and text   Did patient suffer any verbal/emotional/physical/sexual abuse as a child?: No Did patient suffer from severe childhood neglect?: No Has patient ever been sexually abused/assaulted/raped as an adolescent or adult?: No Was the patient ever a victim of a crime or a disaster?: No Witnessed domestic violence?: No Has patient been effected by domestic violence as an adult?: No  CCA Part Two B  Employment/Work Situation: Employment / Work Psychologist, occupational Employment situation: Unemployed(Had full time internship in the spring.) What is the longest time patient has a held a job?: 5 years 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  Takes walks with her dog.  Job Psychologist, educational.   Spend time with her mom.    Exercise/Diet: Exercise/Diet Do You Exercise?: Yes How Many Times a Week Do You Exercise?: 1-3 times a week Have You Gained or Lost A Significant Amount of Weight in the Past Six Months?: No Do You Follow a Special Diet?: No Do You Have Any Trouble Sleeping?: Yes Explanation of Sleeping Difficulties: Taking trazodone to help her fall and stay asleep  CCA Part Two C  Alcohol/Drug Use: Alcohol / Drug Use History of alcohol / drug use?: No history of alcohol / drug abuse                      CCA Part Three  ASAM's:  Six Dimensions of Multidimensional Assessment  Dimension 1:  Acute Intoxication and/or Withdrawal Potential:     Dimension 2:  Biomedical Conditions and Complications:     Dimension 3:  Emotional, Behavioral, or Cognitive Conditions and Complications:     Dimension 4:  Readiness to Change:     Dimension 5:  Relapse, Continued use, or Continued Problem Potential:     Dimension 6:  Recovery/Living Environment:      Substance use Disorder (SUD)    Social Function:  Social Functioning Social Maturity: Responsible(Working on developing new friendships.) Social Judgement: Normal  Stress:  Stress Stressors: Work, Transitions Patient Takes Medications The Way The Doctor Instructed?: Yes  Risk Assessment-  Self-Harm Potential: Risk Assessment For Self-Harm Potential Thoughts of Self-Harm: No current thoughts  Risk Assessment -Dangerous to Others Potential:    DSM5 Diagnoses: Patient Active Problem List   Diagnosis Date Noted  . Anxiety 12/23/2017  . Moderate episode of recurrent major depressive disorder (HCC) 12/23/2017  . Dyspareunia in female 02/20/2016  . Encounter for female birth control 11/28/2015  . Frequent headaches 11/28/2015  . Acute maxillary sinusitis 12/12/2013  . Menorrhagia with irregular cycle 12/06/2013  . Extrinsic asthma 12/06/2013  . Seasonal allergies 12/06/2013      Recommendations for  Services/Supports/Treatments: Recommendations for Services/Supports/Treatments Recommendations For Services/Supports/Treatments: Individual Therapy, Medication Management    Marilu FavreSolomon, Sarah A

## 2018-03-18 ENCOUNTER — Other Ambulatory Visit: Payer: Self-pay

## 2018-03-18 ENCOUNTER — Encounter (HOSPITAL_COMMUNITY): Payer: Self-pay | Admitting: Psychiatry

## 2018-03-18 ENCOUNTER — Ambulatory Visit (INDEPENDENT_AMBULATORY_CARE_PROVIDER_SITE_OTHER): Payer: 59 | Admitting: Psychiatry

## 2018-03-18 VITALS — BP 126/80 | HR 76 | Ht 63.0 in | Wt 190.0 lb

## 2018-03-18 DIAGNOSIS — F331 Major depressive disorder, recurrent, moderate: Secondary | ICD-10-CM | POA: Diagnosis not present

## 2018-03-18 DIAGNOSIS — F5102 Adjustment insomnia: Secondary | ICD-10-CM | POA: Diagnosis not present

## 2018-03-18 DIAGNOSIS — F411 Generalized anxiety disorder: Secondary | ICD-10-CM | POA: Diagnosis not present

## 2018-03-18 MED ORDER — TRAZODONE HCL 50 MG PO TABS: 50.0000 mg | ORAL_TABLET | Freq: Every day | ORAL | 0 refills | Status: DC

## 2018-03-18 NOTE — Progress Notes (Signed)
[facility]   Patient Identification: Anita ArnoldChloe Baird MRN:  [id number] Date of Evaluation:  03/18/2018 Referral Source: Primary care Chief Complaint:   Chief Complaint    Follow-up; Other     Visit Diagnosis:    ICD-10-CM   1. Major depressive disorder, recurrent episode, moderate (HCC) F33.1   2. GAD (generalized anxiety disorder) F41.1   3. Adjustment insomnia F51.02     History of Present Illness: 27 years old currently living with her boyfriend white female [employer].  Referred initially  for management of depression she has taken Zoloft in the past that caused weight gain Wellbutrin did not help.  That happened around age 27 when she was diagnosed with depression.  Recently for the last 8 to 9 months has been feeling low She was started [place] but that made her sleep more so she stopped it.   Last visit started on Lexapro now at a dose of 10 mg she is doing better her mood is better her energy is better anxiety is less her boyfriend is also mentioned to her that she is doing better. Sleep is improved is also on trazodone takes as needed Denies any past trauma  Modifying factors; boyfriend, pets Aggravating factors; looking for job    Past Psychiatric History: depression  Previous Psychotropic Medications: Yes   Substance Abuse History in the last 12 months:  Yes.    Consequences of Substance Abuse: NA  Past Medical History:  Past Medical History:  Diagnosis Date  . Asthma   . Depression     Past Surgical History:  Procedure Laterality Date  . TONSILLECTOMY      Family Psychiatric History: depression Mom; dad; siblings: uncle:  depression  Family History:  Family History  Problem Relation Age of Onset  . Diabetes Maternal Grandfather   . Diabetes Paternal Grandmother   . Cancer Paternal Grandfather     Social History:   Social History   Socioeconomic History  . Marital status: Single    Spouse name: Not on file  .  Number of children: Not on file  . Years of education: Not on file  . Highest education level: Not on file  Occupational History  . Not on file  Social Needs  . Financial resource strain: Not on file  . Food insecurity:    Worry: Not on file    Inability: Not on file  . Transportation needs:    Medical: Not on file    Non-medical: Not on file  Tobacco Use  . Smoking status: Never Smoker  . Smokeless tobacco: Never Used  Substance and Sexual Activity  . Alcohol use: Yes    Comment: 1-2 times weekly, wine or beer  . Drug use: Yes    Types: Marijuana    Comment: last used in September 2019  . Sexual activity: Yes    Birth control/protection: IUD  Lifestyle  . Physical activity:    Days per week: Not on file    Minutes per session: Not on file  . Stress: Not on file  Relationships  . Social connections:    Talks on phone: Not on file    Gets together: Not on file    Attends religious service: Not on file    Active member of club or organization: Not on file    Attends meetings of clubs or organizations: Not on file    Relationship status: Not on file  Other Topics Concern  . Not on file  Social History Narrative  . Not on file        Allergies:   Allergies  Allergen Reactions  . Septra [Sulfamethoxazole-Trimethoprim]   . Sulfonamide Derivatives     REACTION: hives    Metabolic Disorder Labs: No results found for: HGBA1C, MPG Lab Results  Component Value Date   PROLACTIN 10.4 11/22/2012   No results found for: CHOL, TRIG, HDL, CHOLHDL, VLDL, LDLCALC   Current Medications: Current Outpatient Medications  Medication Sig Dispense Refill  . escitalopram (LEXAPRO) 10 MG tablet Take 1 tablet (10 mg total) by mouth daily. 30 tablet 1  . Levonorgestrel (KYLEENA IU) by Intrauterine route.    . traZODone (DESYREL) 50 MG tablet Take 1 tablet (50 mg total) by mouth at bedtime. 30 tablet 0  . lidocaine (XYLOCAINE) 2 % jelly APPLY 1 APPLICATION TOPICALLY AS NEEDED.  (Patient not taking: Reported on 02/18/2018) 15 mL 2  . PROAIR RESPICLICK 108 (90 Base) MCG/ACT AEPB INHALE 2 PUFFS INTO THE LUNGS EVERY 4 (FOUR) HOURS AS NEEDED. (Patient not taking: Reported on 02/18/2018) 1 each 3   No current facility-administered medications for this visit.       Psychiatric Specialty Exam: Review of Systems  Cardiovascular: Negative for palpitations.  Musculoskeletal: Negative for back pain.  Skin: Negative for rash.  Neurological: Negative for tremors.  Psychiatric/Behavioral: Negative for depression.    Blood pressure 126/80, pulse 76, height 5\' 3"  (1.6 m), weight 190 lb (86.2 kg).Body mass index is 33.66 kg/m.  General Appearance: Casual  Eye Contact:  Fair  Speech:  Slow  Volume:  Decreased  Mood: better  Affect:  Less constricted  Thought Process:  Goal Directed  Orientation:  Full (Time, Place, and Person)  Thought Content:  Rumination  Suicidal Thoughts:  No  Homicidal Thoughts:  No  Memory:  Immediate;   Fair Recent;   Fair  Judgement:  Fair  Insight:  Present  Psychomotor Activity:  Decreased  Concentration:  Concentration: Fair and Attention Span: Fair  Recall:  [employer] of Knowledge:Good  Language: Good  Akathisia:  No  Handed:  Right  AIMS (if indicated):    Assets:  Desire for Improvement  ADL's:  Intact  Cognition: WNL  Sleep:  poor    Treatment Plan Summary: Medication management and Plan as follows  MDD moderate, recurrent: improved. Continue lexapro 10mg   GAD" better. Continue lexapro 10mg  Insomnia: better on trazadone. Take prn. Will refill  Continue therapy fu 5w.   Thresa Ross, MD 12/5/201910:08 AM

## 2018-03-30 ENCOUNTER — Ambulatory Visit (INDEPENDENT_AMBULATORY_CARE_PROVIDER_SITE_OTHER): Payer: 59 | Admitting: Licensed Clinical Social Worker

## 2018-03-30 DIAGNOSIS — F331 Major depressive disorder, recurrent, moderate: Secondary | ICD-10-CM

## 2018-03-30 DIAGNOSIS — F411 Generalized anxiety disorder: Secondary | ICD-10-CM | POA: Diagnosis not present

## 2018-03-30 DIAGNOSIS — F5102 Adjustment insomnia: Secondary | ICD-10-CM | POA: Diagnosis not present

## 2018-03-30 NOTE — Progress Notes (Signed)
   THERAPIST PROGRESS NOTE  Session Time: 8:38am-9:30am  Participation Level: Active  Behavioral Response: CasualAlertEuthymic  Type of Therapy: Individual Therapy  Treatment Goals addressed: Increase energy, participation and enjoyment in activities   Changing thinking patterns to be more helpful  Interventions: Treatment planning, mindfulness    Suicidal/Homicidal: Denied both   Therapist Interventions: Collaborated with patient to develop her treatment plan.  Briefly described interventions she can expect as she participates in therapy. Assessed patient's knowledge of depression and recommended treatment.    Introduced the concept of mindfulness.  Emphasized how learning to focus on the present can help you to feel more in control of your emotions.  Explained how it can be useful to practice at times when you catch yourself having unhelpful thoughts.     Summary:  Decided on goals to increase energy and participation and enjoyment of activities, and maintain Baird positive outlook.  Very knowledgable about depression and its symptoms.  Noted that several family members have experienced it.  The main supports in her life understand what it is.    Not familiar with mindfulness.  Commented on how she has practiced it unconsciously at times.  Not much experience with meditation.  Expressed willingness to give it Baird try.       Plan: Return in 2-3 weeks.  Expand on mindfulness by teaching exercises for breathing, mindful activity, and focusing on an object.    Diagnosis: MDD, recurrent, moderate                          GAD    Anita Baird, Anita Canby A, LCSW 03/30/2018

## 2018-04-09 ENCOUNTER — Other Ambulatory Visit (HOSPITAL_COMMUNITY): Payer: Self-pay | Admitting: Psychiatry

## 2018-04-13 ENCOUNTER — Encounter (HOSPITAL_COMMUNITY): Payer: Self-pay | Admitting: Psychiatry

## 2018-04-15 ENCOUNTER — Encounter (HOSPITAL_COMMUNITY): Payer: Self-pay | Admitting: Psychiatry

## 2018-04-15 ENCOUNTER — Other Ambulatory Visit: Payer: Self-pay

## 2018-04-15 ENCOUNTER — Ambulatory Visit (HOSPITAL_COMMUNITY): Payer: 59 | Admitting: Psychiatry

## 2018-04-15 ENCOUNTER — Ambulatory Visit (INDEPENDENT_AMBULATORY_CARE_PROVIDER_SITE_OTHER): Payer: 59 | Admitting: Psychiatry

## 2018-04-15 VITALS — BP 118/78 | HR 81 | Ht 63.0 in | Wt 193.0 lb

## 2018-04-15 DIAGNOSIS — F5102 Adjustment insomnia: Secondary | ICD-10-CM

## 2018-04-15 DIAGNOSIS — F331 Major depressive disorder, recurrent, moderate: Secondary | ICD-10-CM | POA: Diagnosis not present

## 2018-04-15 DIAGNOSIS — F411 Generalized anxiety disorder: Secondary | ICD-10-CM

## 2018-04-15 MED ORDER — ESCITALOPRAM OXALATE 10 MG PO TABS: 10.0000 mg | ORAL_TABLET | Freq: Every day | ORAL | 2 refills | Status: DC

## 2018-04-15 MED ORDER — TRAZODONE HCL 50 MG PO TABS: 50.0000 mg | ORAL_TABLET | Freq: Every day | ORAL | 0 refills | Status: DC

## 2018-04-15 NOTE — Progress Notes (Signed)
[facility] Outpatient Follow up visit   Patient Identification: Anita Baird MRN:  [id number] Date of Evaluation:  04/15/2018 Referral Source: Primary care Chief Complaint:   Chief Complaint    Follow-up; Other     Visit Diagnosis:    ICD-10-CM   1. Major depressive disorder, recurrent episode, moderate (HCC) F33.1   2. GAD (generalized anxiety disorder) F41.1   3. Adjustment insomnia F51.02     History of Present Illness: 28 years old currently living with her boyfriend white female graduated from [employer].  Referred initially  for management of depression she has taken Zoloft in the past that caused weight gain Wellbutrin did not help.  That happened around age 21 when she was diagnosed with depression.  Doing fair on lexapro now. Dose is 10mg   Planning to change job. Looking forward to it Sleep is improved is also on trazodone takes as needed Denies any past trauma  Modifying factors; boyfriend, pets Aggravating factors; job stress    Past Psychiatric History: depression  Previous Psychotropic Medications: Yes   Substance Abuse History in the last 12 months:  Yes.    Consequences of Substance Abuse: NA  Past Medical History:  Past Medical History:  Diagnosis Date  . Asthma   . Depression     Past Surgical History:  Procedure Laterality Date  . TONSILLECTOMY      Family Psychiatric History: depression Mom; dad; siblings: uncle:  depression  Family History:  Family History  Problem Relation Age of Onset  . Diabetes Maternal Grandfather   . Diabetes Paternal Grandmother   . Cancer Paternal Grandfather     Social History:   Social History   Socioeconomic History  . Marital status: Single    Spouse name: Not on file  . Number of children: Not on file  . Years of education: Not on file  . Highest education level: Not on file  Occupational History  . Not on file  Social Needs  . Financial resource strain: Not on file  . Food insecurity:    Worry: Not on  file    Inability: Not on file  . Transportation needs:    Medical: Not on file    Non-medical: Not on file  Tobacco Use  . Smoking status: Never Smoker  . Smokeless tobacco: Never Used  Substance and Sexual Activity  . Alcohol use: Yes    Comment: 1-2 times weekly, wine or beer  . Drug use: Yes    Types: Marijuana    Comment: last used in September 2019  . Sexual activity: Yes    Birth control/protection: I.U.D.  Lifestyle  . Physical activity:    Days per week: Not on file    Minutes per session: Not on file  . Stress: Not on file  Relationships  . Social connections:    Talks on phone: Not on file    Gets together: Not on file    Attends religious service: Not on file    Active member of club or organization: Not on file    Attends meetings of clubs or organizations: Not on file    Relationship status: Not on file  Other Topics Concern  . Not on file  Social History Narrative  . Not on file        Allergies:   Allergies  Allergen Reactions  . Septra [Sulfamethoxazole-Trimethoprim]   . Sulfonamide Derivatives     REACTION: hives    Metabolic Disorder Labs: No results found for: HGBA1C, [facility]  Results  Component Value Date   PROLACTIN 10.4 11/22/2012   No results found for: CHOL, TRIG, HDL, CHOLHDL, VLDL, LDLCALC   Current Medications: Current Outpatient Medications  Medication Sig Dispense Refill  . escitalopram (LEXAPRO) 10 MG tablet Take 1 tablet (10 mg total) by mouth daily. 30 tablet 2  . Levonorgestrel (KYLEENA IU) by Intrauterine route.    . traZODone (DESYREL) 50 MG tablet Take 1 tablet (50 mg total) by mouth at bedtime. 30 tablet 0  . lidocaine (XYLOCAINE) 2 % jelly APPLY 1 APPLICATION TOPICALLY AS NEEDED. (Patient not taking: Reported on 02/18/2018) 15 mL 2  . PROAIR RESPICLICK 108 (90 Base) MCG/ACT AEPB INHALE 2 PUFFS INTO THE LUNGS EVERY 4 (FOUR) HOURS AS NEEDED. (Patient not taking: Reported on 02/18/2018) 1 each 3   No current  facility-administered medications for this visit.       Psychiatric Specialty Exam: Review of Systems  Cardiovascular: Negative for chest pain.  Musculoskeletal: Negative for back pain.  Skin: Negative for rash.  Neurological: Negative for tremors.  Psychiatric/Behavioral: Negative for depression.    Blood pressure 118/78, pulse 81, height 5\' 3"  (1.6 m), weight 193 lb (87.5 kg).Body mass index is 34.19 kg/m.  General Appearance: Casual  Eye Contact:  Fair  Speech:  Slow  Volume:  Decreased  Mood: fair  Affect:  Less constricted  Thought Process:  Goal Directed  Orientation:  Full (Time, Place, and Person)  Thought Content:  Rumination  Suicidal Thoughts:  No  Homicidal Thoughts:  No  Memory:  Immediate;   Fair Recent;   Fair  Judgement:  Fair  Insight:  Present  Psychomotor Activity:  Decreased  Concentration:  Concentration: Fair and Attention Span: Fair  Recall:  [employer] of Knowledge:Good  Language: Good  Akathisia:  No  Handed:  Right  AIMS (if indicated):    Assets:  Desire for Improvement  ADL's:  Intact  Cognition: WNL  Sleep:  poor    Treatment Plan Summary: Medication management and Plan as follows  MDD moderate, recurrent: better. Continue lexapro 10mg .  GAD" better. Continue lexapro 10mg  Insomnia: better on trazadone. Take prn. Will refill  Fu 52m  Thresa Ross, MD 1/2/20201:53 PM

## 2018-05-10 ENCOUNTER — Other Ambulatory Visit (HOSPITAL_COMMUNITY): Payer: Self-pay | Admitting: Psychiatry

## 2018-07-14 ENCOUNTER — Ambulatory Visit (INDEPENDENT_AMBULATORY_CARE_PROVIDER_SITE_OTHER): Payer: 59 | Admitting: Psychiatry

## 2018-07-14 ENCOUNTER — Encounter (HOSPITAL_COMMUNITY): Payer: Self-pay | Admitting: Psychiatry

## 2018-07-14 DIAGNOSIS — F411 Generalized anxiety disorder: Secondary | ICD-10-CM | POA: Diagnosis not present

## 2018-07-14 DIAGNOSIS — F5105 Insomnia due to other mental disorder: Secondary | ICD-10-CM | POA: Diagnosis not present

## 2018-07-14 DIAGNOSIS — Z79899 Other long term (current) drug therapy: Secondary | ICD-10-CM

## 2018-07-14 DIAGNOSIS — F331 Major depressive disorder, recurrent, moderate: Secondary | ICD-10-CM | POA: Diagnosis not present

## 2018-07-14 MED ORDER — ESCITALOPRAM OXALATE 20 MG PO TABS: 20.0000 mg | ORAL_TABLET | Freq: Every day | ORAL | 0 refills | Status: DC

## 2018-07-14 NOTE — Progress Notes (Signed)
[facility] Tele psych  visit   Patient Identification: Anita Baird MRN:  [id number] Date of Evaluation:  07/14/2018 Referral Source: Primary care Chief Complaint:    Visit Diagnosis:    ICD-10-CM   1. Major depressive disorder, recurrent episode, moderate (HCC) F33.1   2. GAD (generalized anxiety disorder) F41.1     History of Present Illness: 28 years old currently living with her boyfriend white female graduated from [employer].   I connected with Linford Arnold on 07/14/18 at  8:30 AM EDT by telephone and verified that I am speaking with the correct person using two identifiers.   I discussed the limitations, risks, security and privacy concerns of performing an evaluation and management service by telephone and the availability of in person appointments. I also discussed with the patient that there may be a patient responsible charge related to this service. The patient expressed understanding and agreed to proceed.  Works for health department. 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 Taking lexapro 10mg    Not taking trazadone   Modifying factors; boyfriend, pets Aggravating factors; job stress    Past Psychiatric History: depression  Previous Psychotropic Medications: Yes   Substance Abuse History in the last 12 months:  Yes.    Consequences of Substance Abuse: NA  Past Medical History:  Past Medical History:  Diagnosis Date  . Asthma   . Depression     Past Surgical History:  Procedure Laterality Date  . TONSILLECTOMY      Family Psychiatric History: depression Mom; dad; siblings: uncle:  depression  Family History:  Family History  Problem Relation Age of Onset  . Diabetes Maternal Grandfather   . Diabetes Paternal Grandmother   . Cancer Paternal Grandfather     Social History:   Social History   Socioeconomic History  . Marital status: Single    Spouse name: Not on file  . Number of children: Not on file  . Years of education: Not on file  . Highest  education level: Not on file  Occupational History  . Not on file  Social Needs  . Financial resource strain: Not on file  . Food insecurity:    Worry: Not on file    Inability: Not on file  . Transportation needs:    Medical: Not on file    Non-medical: Not on file  Tobacco Use  . Smoking status: Never Smoker  . Smokeless tobacco: Never Used  Substance and Sexual Activity  . Alcohol use: Yes    Comment: 1-2 times weekly, wine or beer  . Drug use: Yes    Types: Marijuana    Comment: last used in September 2019  . Sexual activity: Yes    Birth control/protection: I.U.D.  Lifestyle  . Physical activity:    Days per week: Not on file    Minutes per session: Not on file  . Stress: Not on file  Relationships  . Social connections:    Talks on phone: Not on file    Gets together: Not on file    Attends religious service: Not on file    Active member of club or organization: Not on file    Attends meetings of clubs or organizations: Not on file    Relationship status: Not on file  Other Topics Concern  . Not on file  Social History Narrative  . Not on file        Allergies:   Allergies  Allergen Reactions  . Septra [Sulfamethoxazole-Trimethoprim]   . Sulfonamide Derivatives  REACTION: hives    Metabolic Disorder Labs: No results found for: HGBA1C, [facility] Lab Results  Component Value Date   PROLACTIN 10.4 11/22/2012   No results found for: CHOL, TRIG, HDL, CHOLHDL, VLDL, LDLCALC   Current Medications: Current Outpatient Medications  Medication Sig Dispense Refill  . escitalopram (LEXAPRO) 20 MG tablet Take 1 tablet (20 mg total) by mouth daily. 30 tablet 0  . Levonorgestrel (KYLEENA IU) by Intrauterine route.    . lidocaine (XYLOCAINE) 2 % jelly APPLY 1 APPLICATION TOPICALLY AS NEEDED. (Patient not taking: Reported on 02/18/2018) 15 mL 2  . PROAIR RESPICLICK 108 (90 Base) MCG/ACT AEPB INHALE 2 PUFFS INTO THE LUNGS EVERY 4 (FOUR) HOURS AS NEEDED. (Patient not  taking: Reported on 02/18/2018) 1 each 3  . traZODone (DESYREL) 50 MG tablet Take 1 tablet (50 mg total) by mouth at bedtime. 30 tablet 0   No current facility-administered medications for this visit.       Psychiatric Specialty Exam: Review of Systems  Cardiovascular: Negative for chest pain.  Musculoskeletal: Negative for back pain.  Skin: Negative for rash.  Neurological: Negative for tremors.  Psychiatric/Behavioral: Positive for depression.    There were no vitals taken for this visit.There is no height or weight on file to calculate BMI.  General Appearance:   Eye Contact:   Speech:  Slow  Volume:  Decreased  Mood:subdued  Affect:   Thought Process:  Goal Directed  Orientation:  Full (Time, Place, and Person)  Thought Content:  Rumination  Suicidal Thoughts:  No  Homicidal Thoughts:  No  Memory:  Immediate;   Fair Recent;   Fair  Judgement:  Fair  Insight:  Present  Psychomotor Activity:  Decreased  Concentration:  Concentration: Fair and Attention Span: Fair  Recall:  [employer] of Knowledge:Good  Language: Good  Akathisia:  No  Handed:  Right  AIMS (if indicated):    Assets:  Desire for Improvement  ADL's:  Intact  Cognition: WNL  Sleep:  poor    Treatment Plan Summary: Medication management and Plan as follows  MDD moderate, recurrent:dysphoric, increase lexapro to 20mg  GAD"fluctuates, increase lexapro Insomnia: better on trazadone. Take prn.   Reviewed meds, no side effects The patient was advised to call back or seek an in-person evaluation if the symptoms worsen or if the condition fails to improve as anticipated.  I provided 15 minutes of non-face-to-face time during this encounter. Fu 6w   Thresa Ross, MD 4/1/20208:46 AM

## 2018-08-05 ENCOUNTER — Other Ambulatory Visit (HOSPITAL_COMMUNITY): Payer: Self-pay | Admitting: Psychiatry

## 2018-08-24 ENCOUNTER — Other Ambulatory Visit: Payer: Self-pay

## 2018-08-24 ENCOUNTER — Ambulatory Visit (INDEPENDENT_AMBULATORY_CARE_PROVIDER_SITE_OTHER): Payer: 59 | Admitting: Psychiatry

## 2018-08-24 ENCOUNTER — Encounter (HOSPITAL_COMMUNITY): Payer: Self-pay | Admitting: Psychiatry

## 2018-08-24 DIAGNOSIS — F5102 Adjustment insomnia: Secondary | ICD-10-CM

## 2018-08-24 DIAGNOSIS — F331 Major depressive disorder, recurrent, moderate: Secondary | ICD-10-CM | POA: Diagnosis not present

## 2018-08-24 DIAGNOSIS — F411 Generalized anxiety disorder: Secondary | ICD-10-CM

## 2018-08-24 NOTE — Progress Notes (Signed)
[facility] Tele psych  visit   Patient Identification: Anita ArnoldChloe Baird MRN:  [id number] Date of Evaluation:  08/24/2018 Referral Source: Primary care Chief Complaint:    Visit Diagnosis:    ICD-10-CM   1. Major depressive disorder, recurrent episode, moderate (HCC) F33.1   2. GAD (generalized anxiety disorder) F41.1   3. Adjustment insomnia F51.02     History of Present Illness: 28 years old currently living with her boyfriend white female [employer]. I connected with Anita Baird on 08/24/18 at  8:45 AM EDT by telephone and verified that I am speaking with the correct person using two identifiers.   I discussed the limitations, risks, security and privacy concerns of performing an evaluation and management service by telephone and the availability of in person appointments. I also discussed with the patient that there may be a patient responsible charge related to this service. The patient expressed understanding and agreed to proceed.   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 Less depressed Closed in a house. Looking forward for that  Not taking trazadone   Modifying factors; boyfriend, pets Aggravating factors; job stress    Past Psychiatric History: depression  Previous Psychotropic Medications: Yes   Substance Abuse History in the last 12 months:  Yes.    Consequences of Substance Abuse: NA  Past Medical History:  Past Medical History:  Diagnosis Date  . Asthma   . Depression     Past Surgical History:  Procedure Laterality Date  . TONSILLECTOMY      Family Psychiatric History: depression Mom; dad; siblings: uncle:  depression  Family History:  Family History  Problem Relation Age of Onset  . Diabetes Maternal Grandfather   . Diabetes Paternal Grandmother   . Cancer Paternal Grandfather     Social History:   Social History   Socioeconomic History  . Marital status: Single    Spouse  name: Not on file  . Number of children: Not on file  . Years of education: Not on file  . Highest education level: Not on file  Occupational History  . Not on file  Social Needs  . Financial resource strain: Not on file  . Food insecurity:    Worry: Not on file    Inability: Not on file  . Transportation needs:    Medical: Not on file    Non-medical: Not on file  Tobacco Use  . Smoking status: Never Smoker  . Smokeless tobacco: Never Used  Substance and Sexual Activity  . Alcohol use: Yes    Comment: 1-2 times weekly, wine or beer  . Drug use: Yes    Types: Marijuana    Comment: last used in September 2019  . Sexual activity: Yes    Birth control/protection: I.U.D.  Lifestyle  . Physical activity:    Days per week: Not on file    Minutes per session: Not on file  . Stress: Not on file  Relationships  . Social connections:    Talks on phone: Not on file    Gets together: Not on file    Attends religious service: Not on file    Active member of club or organization: Not on file    Attends meetings of clubs or organizations: Not on file    Relationship status: Not on file  Other Topics Concern  . Not on file  Social History Narrative  . Not on file        Allergies:  Allergies  Allergen Reactions  . Septra [Sulfamethoxazole-Trimethoprim]   . Sulfonamide Derivatives     REACTION: hives    Metabolic Disorder Labs: No results found for: HGBA1C, [facility] Lab Results  Component Value Date   PROLACTIN 10.4 11/22/2012   No results found for: CHOL, TRIG, HDL, CHOLHDL, VLDL, LDLCALC   Current Medications: Current Outpatient Medications  Medication Sig Dispense Refill  . escitalopram (LEXAPRO) 20 MG tablet TAKE 1 TABLET BY MOUTH EVERY DAY 90 tablet 1  . Levonorgestrel (KYLEENA IU) by Intrauterine route.    . lidocaine (XYLOCAINE) 2 % jelly APPLY 1 APPLICATION TOPICALLY AS NEEDED. (Patient not taking: Reported on 02/18/2018) 15 mL 2  . PROAIR RESPICLICK 108 (90  Base) MCG/ACT AEPB INHALE 2 PUFFS INTO THE LUNGS EVERY 4 (FOUR) HOURS AS NEEDED. (Patient not taking: Reported on 02/18/2018) 1 each 3  . traZODone (DESYREL) 50 MG tablet Take 1 tablet (50 mg total) by mouth at bedtime. 30 tablet 0   No current facility-administered medications for this visit.       Psychiatric Specialty Exam: Review of Systems  Cardiovascular: Negative for chest pain.  Musculoskeletal: Negative for back pain.  Skin: Negative for rash.  Neurological: Negative for tremors.  Psychiatric/Behavioral: Negative for depression.    There were no vitals taken for this visit.There is no height or weight on file to calculate BMI.  General Appearance:   Eye Contact:   Speech:  Slow  Volume:  Decreased  Mood:better  Affect:   Thought Process:  Goal Directed  Orientation:  Full (Time, Place, and Person)  Thought Content:  Rumination  Suicidal Thoughts:  No  Homicidal Thoughts:  No  Memory:  Immediate;   Fair Recent;   Fair  Judgement:  Fair  Insight:  Present  Psychomotor Activity:  Decreased  Concentration:  Concentration: Fair and Attention Span: Fair  Recall:  [employer] of Knowledge:Good  Language: Good  Akathisia:  No  Handed:  Right  AIMS (if indicated):    Assets:  Desire for Improvement  ADL's:  Intact  Cognition: WNL  Sleep:  poor    Treatment Plan Summary: Medication management and Plan as follows  MDD moderate, recurrent:better. Continue lexapro 20mg  GAD"not worse, continue lexapro.  No side effects Insomnia: better on trazadone. Take prn.   Reviewed meds, no side effects The patient was advised to call back or seek an in-person evaluation if the symptoms worsen or if the condition fails to improve as anticipated.  I provided 15 minutes of non-face-to-face time during this encounter. Fu 84m.   Thresa Ross, MD 5/12/20208:49 AM

## 2018-10-27 ENCOUNTER — Encounter (HOSPITAL_COMMUNITY): Payer: Self-pay | Admitting: Psychiatry

## 2018-10-27 ENCOUNTER — Ambulatory Visit (INDEPENDENT_AMBULATORY_CARE_PROVIDER_SITE_OTHER): Payer: 59 | Admitting: Psychiatry

## 2018-10-27 ENCOUNTER — Other Ambulatory Visit: Payer: Self-pay

## 2018-10-27 DIAGNOSIS — F331 Major depressive disorder, recurrent, moderate: Secondary | ICD-10-CM | POA: Diagnosis not present

## 2018-10-27 DIAGNOSIS — F411 Generalized anxiety disorder: Secondary | ICD-10-CM | POA: Diagnosis not present

## 2018-10-27 DIAGNOSIS — F5102 Adjustment insomnia: Secondary | ICD-10-CM | POA: Diagnosis not present

## 2018-10-27 MED ORDER — ESCITALOPRAM OXALATE 20 MG PO TABS: 30.0000 mg | ORAL_TABLET | Freq: Every day | ORAL | 1 refills | Status: DC

## 2018-10-27 NOTE — Progress Notes (Signed)
[facility]   Patient Identification: Anita ArnoldChloe Baird MRN:  [id number] Date of Evaluation:  10/27/2018 Referral Source: Primary care Chief Complaint:   depression follow up Visit Diagnosis:    ICD-10-CM   1. Major depressive disorder, recurrent episode, moderate (HCC)  F33.1   2. GAD (generalized anxiety disorder)  F41.1   3. Adjustment insomnia  F51.02     History of Present Illness: 28  years old currently living with her boyfriend white female [employer]. .   I connected with Anita Arnoldhloe Teachey on 10/27/18 at 10:30 AM EDT by telephone and verified that I am speaking with the correct person using two identifiers. I discussed the limitations, risks, security and privacy concerns of performing an evaluation and management service by telephone and the availability of in person appointments. I also discussed with the patient that there may be a patient responsible charge related to this service. The patient expressed understanding and agreed to proceed.   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 Was looking forward for that Not taking trazadone regularly Sleep is fair   Modifying factors; boyfriend, pets Aggravating factors; job stress    Past Psychiatric History: depression  Previous Psychotropic Medications: Yes   Substance Abuse History in the last 12 months:  Yes.    Consequences of Substance Abuse: NA  Past Medical History:  Past Medical History:  Diagnosis Date  . Asthma   . Depression     Past Surgical History:  Procedure Laterality Date  . TONSILLECTOMY      Family Psychiatric History: depression Mom; dad; siblings: uncle:  depression  Family History:  Family History  Problem Relation Age of Onset  . Diabetes Maternal Grandfather   . Diabetes Paternal Grandmother   . Cancer Paternal Grandfather     Social History:   Social History    Socioeconomic History  . Marital status: Single    Spouse name: Not on file  . Number of children: Not on file  . Years of education: Not on file  . Highest education level: Not on file  Occupational History  . Not on file  Social Needs  . Financial resource strain: Not on file  . Food insecurity    Worry: Not on file    Inability: Not on file  . Transportation needs    Medical: Not on file    Non-medical: Not on file  Tobacco Use  . Smoking status: Never Smoker  . Smokeless tobacco: Never Used  Substance and Sexual Activity  . Alcohol use: Yes    Comment: 1-2 times weekly, wine or beer  . Drug use: Yes    Types: Marijuana    Comment: last used in September 2019  . Sexual activity: Yes    Birth control/protection: I.U.D.  Lifestyle  . Physical activity    Days per week: Not on file    Minutes per session: Not on file  . Stress: Not on file  Relationships  . Social Musicianconnections    Talks on phone: Not on file    Gets together: Not on file    Attends religious service: Not on file    Active member of club or organization: Not on file    Attends meetings of clubs or organizations: Not on file    Relationship status: Not on file  Other Topics Concern  . Not on file  Social History Narrative  .  Not on file        Allergies:   Allergies  Allergen Reactions  . Septra [Sulfamethoxazole-Trimethoprim]   . Sulfonamide Derivatives     REACTION: hives    Metabolic Disorder Labs: No results found for: HGBA1C, [facility] Lab Results  Component Value Date   PROLACTIN 10.4 11/22/2012   No results found for: CHOL, TRIG, HDL, CHOLHDL, VLDL, LDLCALC   Current Medications: Current Outpatient Medications  Medication Sig Dispense Refill  . escitalopram (LEXAPRO) 20 MG tablet TAKE 1 TABLET BY MOUTH EVERY DAY 90 tablet 1  . escitalopram (LEXAPRO) 20 MG tablet Take 1.5 tablets (30 mg total) by mouth daily. 45 tablet 1  . Levonorgestrel (KYLEENA IU) by Intrauterine route.     . lidocaine (XYLOCAINE) 2 % jelly APPLY 1 APPLICATION TOPICALLY AS NEEDED. (Patient not taking: Reported on 02/18/2018) 15 mL 2  . PROAIR RESPICLICK 154 (90 Base) MCG/ACT AEPB INHALE 2 PUFFS INTO THE LUNGS EVERY 4 (FOUR) HOURS AS NEEDED. (Patient not taking: Reported on 02/18/2018) 1 each 3  . traZODone (DESYREL) 50 MG tablet Take 1 tablet (50 mg total) by mouth at bedtime. 30 tablet 0   No current facility-administered medications for this visit.       Psychiatric Specialty Exam: Review of Systems  Cardiovascular: Negative for chest pain.  Musculoskeletal: Negative for back pain.  Skin: Negative for rash.  Psychiatric/Behavioral: Positive for depression.    There were no vitals taken for this visit.There is no height or weight on file to calculate BMI.  General Appearance:   Eye Contact:   Speech:  Slow  Volume:  Decreased  Mood:depressed  Affect:   Thought Process:  Goal Directed  Orientation:  Full (Time, Place, and Person)  Thought Content:  Rumination  Suicidal Thoughts:  No  Homicidal Thoughts:  No  Memory:  Immediate;   Fair Recent;   Fair  Judgement:  Fair  Insight:  Present  Psychomotor Activity:  Decreased  Concentration:  Concentration: Fair and Attention Span: Fair  Recall:  [employer] of Knowledge:Good  Language: Good  Akathisia:  No  Handed:  Right  AIMS (if indicated):    Assets:  Desire for Improvement  ADL's:  Intact  Cognition: WNL  Sleep:  poor    Treatment Plan Summary: Medication management and Plan as follows  MDD moderate, recurrent depressed, increase lexapro to 30mg  GAD"; manageable on lexapro Insomnia: takes prn trazadone Reviewed meds, no side effects The patient was advised to call back or seek an in-person evaluation if the symptoms worsen or if the condition fails to improve as anticipated. Call back earlier if symptoms worsen   I provided 15 minutes of non-face-to-face time during this encounter. Fu 66m.   Merian Capron,  MD 7/15/202010:44 AMPatient ID: Anita Baird, female   DOB: [birth year], 28 y.o.   MRN: [id number]

## 2018-11-23 ENCOUNTER — Other Ambulatory Visit (HOSPITAL_COMMUNITY): Payer: Self-pay | Admitting: Psychiatry

## 2018-12-28 ENCOUNTER — Ambulatory Visit (HOSPITAL_COMMUNITY): Payer: 59 | Admitting: Psychiatry

## 2018-12-30 ENCOUNTER — Encounter (HOSPITAL_COMMUNITY): Payer: Self-pay | Admitting: Psychiatry

## 2018-12-30 ENCOUNTER — Ambulatory Visit (INDEPENDENT_AMBULATORY_CARE_PROVIDER_SITE_OTHER): Payer: 59 | Admitting: Psychiatry

## 2018-12-30 DIAGNOSIS — F411 Generalized anxiety disorder: Secondary | ICD-10-CM | POA: Diagnosis not present

## 2018-12-30 DIAGNOSIS — F331 Major depressive disorder, recurrent, moderate: Secondary | ICD-10-CM

## 2018-12-30 DIAGNOSIS — F5102 Adjustment insomnia: Secondary | ICD-10-CM

## 2018-12-30 MED ORDER — ESCITALOPRAM OXALATE 20 MG PO TABS: 20.0000 mg | ORAL_TABLET | Freq: Every day | ORAL | 1 refills | Status: DC

## 2018-12-30 MED ORDER — BUPROPION HCL ER (SR) 100 MG PO TB12: 100.0000 mg | ORAL_TABLET | Freq: Every day | ORAL | 1 refills | Status: DC

## 2018-12-30 NOTE — Progress Notes (Signed)
[facility] Tele [facility]  visit   Patient Identification: Anita ArnoldChloe Baird MRN:  [id number] Date of Evaluation:  12/30/2018 Referral Source: Primary care Chief Complaint:   depression follow up Visit Diagnosis:    ICD-10-CM   1. Major depressive disorder, recurrent episode, moderate (HCC)  F33.1   2. GAD (generalized anxiety disorder)  F41.1   3. Adjustment insomnia  F51.02     History of Present Illness: 28  years old currently living with her boyfriend white female [employer]. .   I connected with Anita Baird on 12/30/18 at  4:15 PM EDT by telephone and verified that I am speaking with the correct person using two identifiers.     I discussed the limitations, risks, security and privacy concerns of performing an evaluation and management service by telephone and the availability of in person appointments. I also discussed with the patient that there may be a patient responsible charge related to this service. The patient expressed understanding and agreed to proceed.   Works for health department. Feels subdued, lexapro increase helped some in the beginning Closed in a house. Was looking forward for that Not taking trazadone regularly Sleep is fair Some concern of weight gain.   Modifying factors; boyfried, pets Aggravating factors; job stress    Past Psychiatric History: depression  Previous Psychotropic Medications: Yes   Substance Abuse History in the last 12 months:  Yes.    Consequences of Substance Abuse: NA  Past Medical History:  Past Medical History:  Diagnosis Date  . Asthma   . Depression     Past Surgical History:  Procedure Laterality Date  . TONSILLECTOMY      Family Psychiatric History: depression Mom; dad; siblings: uncle:  depression  Family History:  Family History  Problem Relation Age of Onset  . Diabetes Maternal Grandfather   . Diabetes Paternal Grandmother   . Cancer Paternal Grandfather     Social History:   Social History    Socioeconomic History  . Marital status: Single    Spouse name: Not on file  . Number of children: Not on file  . Years of education: Not on file  . Highest education level: Not on file  Occupational History  . Not on file  Social Needs  . Financial resource strain: Not on file  . Food insecurity    Worry: Not on file    Inability: Not on file  . Transportation needs    Medical: Not on file    Non-medical: Not on file  Tobacco Use  . Smoking status: Never Smoker  . Smokeless tobacco: Never Used  Substance and Sexual Activity  . Alcohol use: Yes    Comment: 1-2 times weekly, wine or beer  . Drug use: Yes    Types: Marijuana    Comment: last used in September 2019  . Sexual activity: Yes    Birth control/protection: I.U.D.  Lifestyle  . Physical activity    Days per week: Not on file    Minutes per session: Not on file  . Stress: Not on file  Relationships  . Social Musicianconnections    Talks on phone: Not on file    Gets together: Not on file    Attends religious service: Not on file    Active member of club or organization: Not on file    Attends meetings of clubs or organizations: Not on file    Relationship status: Not on file  Other Topics Concern  . Not on file  Social History Narrative  . Not on file        Allergies:   Allergies  Allergen Reactions  . Septra [Sulfamethoxazole-Trimethoprim]   . Sulfonamide Derivatives     REACTION: hives    Metabolic Disorder Labs: No results found for: HGBA1C, MPG Lab Results  Component Value Date   PROLACTIN 10.4 11/22/2012   No results found for: CHOL, TRIG, HDL, CHOLHDL, VLDL, LDLCALC   Current Medications: Current Outpatient Medications  Medication Sig Dispense Refill  . buPROPion (WELLBUTRIN SR) 100 MG 12 hr tablet Take 1 tablet (100 mg total) by mouth daily. 30 tablet 1  . escitalopram (LEXAPRO) 20 MG tablet Take 1 tablet (20 mg total) by mouth daily. 30 tablet 1  . Levonorgestrel (KYLEENA IU) by  Intrauterine route.    . lidocaine (XYLOCAINE) 2 % jelly APPLY 1 APPLICATION TOPICALLY AS NEEDED. (Patient not taking: Reported on 02/18/2018) 15 mL 2  . PROAIR RESPICLICK 601 (90 Base) MCG/ACT AEPB INHALE 2 PUFFS INTO THE LUNGS EVERY 4 (FOUR) HOURS AS NEEDED. (Patient not taking: Reported on 02/18/2018) 1 each 3   No current facility-administered medications for this visit.       Psychiatric Specialty Exam: Review of Systems  Cardiovascular: Negative for chest pain.  Musculoskeletal: Negative for back pain.  Skin: Negative for rash.  Psychiatric/Behavioral: Positive for depression.    There were no vitals taken for this visit.There is no height or weight on file to calculate BMI.  General Appearance:   Eye Contact:   Speech:  Slow  Volume:  Decreased  Mood:subdued  Affect:   Thought Process:  Goal Directed  Orientation:  Full (Time, Place, and Person)  Thought Content:  Rumination  Suicidal Thoughts:  No  Homicidal Thoughts:  No  Memory:  Immediate;   Fair Recent;   Fair  Judgement:  Fair  Insight:  Present  Psychomotor Activity:  Decreased  Concentration:  Concentration: Fair and Attention Span: Fair  Recall:  [employer] of Knowledge:Good  Language: Good  Akathisia:  No  Handed:  Right  AIMS (if indicated):    Assets:  Desire for Improvement  ADL's:  Intact  Cognition: WNL  Sleep:  poor    Treatment Plan Summary: Medication management and Plan as follows  MDD moderate, recurrent subdued, will lower lexapro to 59m due to some concern of weight gain Add wellbutrin 100mg  for augmentation  GAD"; manageable on lexapro Insomnia: takes prn trazadone Reviewed meds,  Discussed activities and distraction from negative thoughts  The patient was advised to call back or seek an in-person evaluation if the symptoms worsen or if the condition fails to improve as anticipated. Call back earlier if symptoms worsen   I provided 15 minutes of non-face-to-face time during this  encounter. Fu 44m.   Merian Capron, MD 9/17/20204:25 PM

## 2019-01-23 ENCOUNTER — Other Ambulatory Visit (HOSPITAL_COMMUNITY): Payer: Self-pay | Admitting: Psychiatry

## 2019-02-03 ENCOUNTER — Ambulatory Visit (INDEPENDENT_AMBULATORY_CARE_PROVIDER_SITE_OTHER): Payer: 59 | Admitting: Psychiatry

## 2019-02-03 ENCOUNTER — Encounter (HOSPITAL_COMMUNITY): Payer: Self-pay | Admitting: Psychiatry

## 2019-02-03 DIAGNOSIS — F331 Major depressive disorder, recurrent, moderate: Secondary | ICD-10-CM | POA: Diagnosis not present

## 2019-02-03 DIAGNOSIS — F5102 Adjustment insomnia: Secondary | ICD-10-CM | POA: Diagnosis not present

## 2019-02-03 DIAGNOSIS — F411 Generalized anxiety disorder: Secondary | ICD-10-CM

## 2019-02-03 MED ORDER — BUPROPION HCL ER (SR) 150 MG PO TB12: 150.0000 mg | ORAL_TABLET | Freq: Every day | ORAL | 1 refills | Status: DC

## 2019-02-03 NOTE — Progress Notes (Signed)
[facility] Tele psych  visit   Patient Identification: Anita Baird MRN:  [id number] Date of Evaluation:  02/03/2019 Referral Source: Primary care Chief Complaint:   depression follow up Visit Diagnosis:    ICD-10-CM   1. Major depressive disorder, recurrent episode, moderate (HCC)  F33.1   2. GAD (generalized anxiety disorder)  F41.1   3. Adjustment insomnia  F51.02     History of Present Illness: 28  years old currently living with her boyfriend white female graduated from [employer]. .     I connected with [name] on 02/03/19 at  4:00 PM EDT by telephone and verified that I am speaking with the correct person using two identifiers.     I discussed the limitations, risks, security and privacy concerns of performing an evaluation and management service by telephone and the availability of in person appointments. I also discussed with the patient that there may be a patient responsible charge related to this service. The patient expressed understanding and agreed to proceed.   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 wellbutrin was started doing some better but subdued, feels wellbutrin should go up  Not taking trazadone regularly Sleep is fair  Modifying factors; boyfriend, pets Aggravating factors; job stress    Past Psychiatric History: depression  Previous Psychotropic Medications: Yes   Substance Abuse History in the last 12 months:  Yes.    Consequences of Substance Abuse: NA  Past Medical History:  Past Medical History:  Diagnosis Date  . Asthma   . Depression     Past Surgical History:  Procedure Laterality Date  . TONSILLECTOMY      Family Psychiatric History: depression Mom; dad; siblings: uncle:  depression  Family History:  Family History  Problem Relation Age of Onset  . Diabetes Maternal Grandfather   . Diabetes Paternal Grandmother   . Cancer Paternal Grandfather     Social  History:   Social History   Socioeconomic History  . Marital status: Single    Spouse name: Not on file  . Number of children: Not on file  . Years of education: Not on file  . Highest education level: Not on file  Occupational History  . Not on file  Social Needs  . Financial resource strain: Not on file  . Food insecurity    Worry: Not on file    Inability: Not on file  . Transportation needs    Medical: Not on file    Non-medical: Not on file  Tobacco Use  . Smoking status: Never Smoker  . Smokeless tobacco: Never Used  Substance and Sexual Activity  . Alcohol use: Yes    Comment: 1-2 times weekly, wine or beer  . Drug use: Yes    Types: Marijuana    Comment: last used in September 2019  . Sexual activity: Yes    Birth control/protection: I.U.D.  Lifestyle  . Physical activity    Days per week: Not on file    Minutes per session: Not on file  . Stress: Not on file  Relationships  . Social Herbalist on phone: Not on file    Gets together: Not on file    Attends religious service: Not on file    Active member of club or organization: Not on file    Attends meetings of clubs or organizations: Not on file    Relationship status: Not on file  Other Topics  Concern  . Not on file  Social History Narrative  . Not on file        Allergies:   Allergies  Allergen Reactions  . Septra [Sulfamethoxazole-Trimethoprim]   . Sulfonamide Derivatives     REACTION: hives    Metabolic Disorder Labs: No results found for: HGBA1C, MPG Lab Results  Component Value Date   PROLACTIN 10.4 11/22/2012   No results found for: CHOL, TRIG, HDL, CHOLHDL, VLDL, LDLCALC   Current Medications: Current Outpatient Medications  Medication Sig Dispense Refill  . buPROPion (WELLBUTRIN SR) 150 MG 12 hr tablet Take 1 tablet (150 mg total) by mouth daily. 30 tablet 1  . escitalopram (LEXAPRO) 20 MG tablet Take 1 tablet (20 mg total) by mouth daily. 30 tablet 1  .  Levonorgestrel (KYLEENA IU) by Intrauterine route.    . lidocaine (XYLOCAINE) 2 % jelly APPLY 1 APPLICATION TOPICALLY AS NEEDED. (Patient not taking: Reported on 02/18/2018) 15 mL 2  . PROAIR RESPICLICK 108 (90 Base) MCG/ACT AEPB INHALE 2 PUFFS INTO THE LUNGS EVERY 4 (FOUR) HOURS AS NEEDED. (Patient not taking: Reported on 02/18/2018) 1 each 3   No current facility-administered medications for this visit.       Psychiatric Specialty Exam: Review of Systems  Cardiovascular: Negative for chest pain.  Musculoskeletal: Negative for back pain.  Skin: Negative for rash.    There were no vitals taken for this visit.There is no height or weight on file to calculate BMI.  General Appearance:   Eye Contact:   Speech:  Slow  Volume:  Decreased  Mood: somewhat subdued  Affect:   Thought Process:  Goal Directed  Orientation:  Full (Time, Place, and Person)  Thought Content:  Rumination  Suicidal Thoughts:  No  Homicidal Thoughts:  No  Memory:  Immediate;   Fair Recent;   Fair  Judgement:  Fair  Insight:  Present  Psychomotor Activity:  Decreased  Concentration:  Concentration: Fair and Attention Span: Fair  Recall:  [employer] of Knowledge:Good  Language: Good  Akathisia:  No  Handed:  Right  AIMS (if indicated):    Assets:  Desire for Improvement  ADL's:  Intact  Cognition: WNL  Sleep:  poor    Treatment Plan Summary: Medication management and Plan as follows  MDD moderate, recurrent : somewhat subdued, wants to increase wellbutrin will do to 150mg  Continue lexapro   GAD"; manageable on lexapro Insomnia: takes prn trazadone Reviewed meds,  Discussed activities and distraction from negative thoughts  The patient was advised to call back or seek an in-person evaluation if the symptoms worsen or if the condition fails to improve as anticipated. Call back earlier if symptoms worsen   I provided 15 minutes of non-face-to-face time during this encounter. Fu 1-63m   3m, MD 10/22/20204:06 PM

## 2019-02-25 ENCOUNTER — Other Ambulatory Visit (HOSPITAL_COMMUNITY): Payer: Self-pay | Admitting: Psychiatry

## 2019-03-26 ENCOUNTER — Other Ambulatory Visit (HOSPITAL_COMMUNITY): Payer: Self-pay | Admitting: Psychiatry

## 2019-03-29 ENCOUNTER — Telehealth: Payer: Self-pay | Admitting: *Deleted

## 2019-03-29 NOTE — Telephone Encounter (Signed)
Returned call from 03/28/2019 at 4:20 PM. Left patient a message to call and schedule IUD removal/insert appointment.

## 2019-04-05 ENCOUNTER — Encounter: Payer: Self-pay | Admitting: Certified Nurse Midwife

## 2019-04-05 ENCOUNTER — Ambulatory Visit (INDEPENDENT_AMBULATORY_CARE_PROVIDER_SITE_OTHER): Payer: 59 | Admitting: Certified Nurse Midwife

## 2019-04-05 ENCOUNTER — Other Ambulatory Visit: Payer: Self-pay

## 2019-04-05 VITALS — BP 127/77 | HR 86 | Temp 98.3°F | Ht 63.0 in | Wt 222.0 lb

## 2019-04-05 DIAGNOSIS — Z124 Encounter for screening for malignant neoplasm of cervix: Secondary | ICD-10-CM

## 2019-04-05 DIAGNOSIS — Z30431 Encounter for routine checking of intrauterine contraceptive device: Secondary | ICD-10-CM

## 2019-04-05 DIAGNOSIS — Z01419 Encounter for gynecological examination (general) (routine) without abnormal findings: Secondary | ICD-10-CM | POA: Diagnosis not present

## 2019-04-05 DIAGNOSIS — Z23 Encounter for immunization: Secondary | ICD-10-CM

## 2019-04-05 NOTE — Progress Notes (Signed)
GYNECOLOGY ANNUAL PREVENTATIVE CARE ENCOUNTER NOTE  History:     Anita Baird is a 28 y.o. G0P0000 female here for a routine annual gynecologic exam and pap smear. Current complaints: none.  Denies abnormal vaginal bleeding, discharge, pelvic pain, problems with intercourse or other gynecologic concerns.    Gynecologic History No LMP recorded. (Menstrual status: IUD). Contraception: IUD- Kyleena placed in 2017  Last Pap: 12/26/2014. Results were: normal  Last mammogram: n/a.  Obstetric History OB History  Gravida Para Term Preterm AB Living  0 0 0 0 0 0  SAB TAB Ectopic Multiple Live Births  0 0 0 0 0    Past Medical History:  Diagnosis Date  . Asthma   . Depression     Past Surgical History:  Procedure Laterality Date  . TONSILLECTOMY      Current Outpatient Medications on File Prior to Visit  Medication Sig Dispense Refill  . buPROPion (WELLBUTRIN SR) 150 MG 12 hr tablet TAKE 1 TABLET BY MOUTH EVERY DAY 30 tablet 0  . escitalopram (LEXAPRO) 20 MG tablet Take 1 tablet (20 mg total) by mouth daily. 30 tablet 1  . Levonorgestrel (KYLEENA IU) by Intrauterine route.    Marland Kitchen PROAIR RESPICLICK 681 (90 Base) MCG/ACT AEPB INHALE 2 PUFFS INTO THE LUNGS EVERY 4 (FOUR) HOURS AS NEEDED. 1 each 3  . lidocaine (XYLOCAINE) 2 % jelly APPLY 1 APPLICATION TOPICALLY AS NEEDED. (Patient not taking: Reported on 02/18/2018) 15 mL 2  . [DISCONTINUED] traZODone (DESYREL) 50 MG tablet Take 1 tablet (50 mg total) by mouth at bedtime. 30 tablet 0  . [DISCONTINUED] Vilazodone HCl (VIIBRYD) 20 MG TABS Take one half tablet for 7 days then increase to 1 full tablet daily. (Patient not taking: Reported on 02/18/2018) 30 tablet 1   No current facility-administered medications on file prior to visit.    Allergies  Allergen Reactions  . Septra [Sulfamethoxazole-Trimethoprim]   . Sulfonamide Derivatives     REACTION: hives    Social History:  reports that she has never smoked. She has never used  smokeless tobacco. She reports current alcohol use. She reports current drug use. Drug: Marijuana.  Family History  Problem Relation Age of Onset  . Diabetes Maternal Grandfather   . Diabetes Paternal Grandmother   . Cancer Paternal Grandfather     The following portions of the patient's history were reviewed and updated as appropriate: allergies, current medications, past family history, past medical history, past social history, past surgical history and problem list.  Review of Systems Pertinent items noted in HPI and remainder of comprehensive ROS otherwise negative.  Physical Exam:  BP 127/77   Pulse 86   Temp 98.3 F (36.8 C)   Ht 5\' 3"  (1.6 m)   Wt 222 lb (100.7 kg)   BMI 39.33 kg/m  CONSTITUTIONAL: Well-developed, well-nourished female in no acute distress.  HENT:  Normocephalic, atraumatic, External right and left ear normal. Oropharynx is clear and moist EYES: Conjunctivae and EOM are normal. Pupils are equal, round, and reactive to light.    SKIN: Skin is warm and dry. No rash noted. Not diaphoretic. No erythema. No pallor. MUSCULOSKELETAL: Normal range of motion. No tenderness.  No cyanosis, clubbing, or edema.  2+ distal pulses. NEUROLOGIC: Alert and oriented to person, place, and time. Normal reflexes, muscle tone coordination.  PSYCHIATRIC: Normal mood and affect. Normal behavior. Normal judgment and thought content. CARDIOVASCULAR: Normal heart rate noted, regular rhythm RESPIRATORY: Clear to auscultation bilaterally. Effort and breath sounds  normal, no problems with respiration noted. BREASTS: Symmetric in size. No masses, tenderness, skin changes, nipple drainage, or lymphadenopathy bilaterally. ABDOMEN: Soft, no distention noted.  No tenderness, rebound or guarding.  PELVIC: Normal appearing external genitalia and urethral meatus; normal appearing pink vaginal mucosa; cervix smooth, pink, without lesions.  No abnormal discharge noted.  Pap smear obtained. IUD  strings seen.  Normal uterine size, no other palpable masses, no uterine or adnexal tenderness.   Assessment and Plan:  1. Well woman exam with routine gynecological exam - Pt here for well woman exam and pap - No complaints or issues today - Pap obtained - Flu shot given today  - Cytology - PAP( [place])   Will follow up results of pap smear and manage accordingly. Routine preventative health maintenance measures emphasized. Please refer to After Visit Summary for other counseling recommendations.   Follow up as needed  Camelia Eng, SNM  I confirm that I have verified the information documented in the nurse midwife student's note and that I have also personally reperformed the history, physical exam and all medical decision making activities of this service and have verified that all service and findings are accurately documented in this student's note.   Patient declines STD screening today  Up to date with vaccinations except flu vaccine - will obtain today  Currently has IUD in place - need to be replaced in 2022  Next pap due 2023 if normal from today's visit    Sharyon Cable, CNM [facility] [employer], [facility]

## 2019-04-07 LAB — CYTOLOGY - PAP
Diagnosis: NEGATIVE
Diagnosis: REACTIVE

## 2019-04-19 ENCOUNTER — Ambulatory Visit (INDEPENDENT_AMBULATORY_CARE_PROVIDER_SITE_OTHER): Payer: 59 | Admitting: Psychiatry

## 2019-04-19 ENCOUNTER — Encounter (HOSPITAL_COMMUNITY): Payer: Self-pay | Admitting: Psychiatry

## 2019-04-19 DIAGNOSIS — F411 Generalized anxiety disorder: Secondary | ICD-10-CM

## 2019-04-19 DIAGNOSIS — F5102 Adjustment insomnia: Secondary | ICD-10-CM

## 2019-04-19 DIAGNOSIS — F331 Major depressive disorder, recurrent, moderate: Secondary | ICD-10-CM

## 2019-04-19 MED ORDER — ESCITALOPRAM OXALATE 20 MG PO TABS: 20.0000 mg | ORAL_TABLET | Freq: Every day | ORAL | 1 refills | Status: DC

## 2019-04-19 MED ORDER — BUPROPION HCL ER (SR) 200 MG PO TB12: 200.0000 mg | ORAL_TABLET | Freq: Every day | ORAL | 1 refills | Status: DC

## 2019-04-19 NOTE — Progress Notes (Signed)
[facility] Tele psych  visit   Patient Identification: Anita Baird MRN:  [id number] Date of Evaluation:  04/19/2019 Referral Source: Primary care Chief Complaint:   depression follow up Visit Diagnosis:    ICD-10-CM   1. Major depressive disorder, recurrent episode, moderate (HCC)  F33.1   2. GAD (generalized anxiety disorder)  F41.1   3. Adjustment insomnia  F51.02     History of Present Illness: 29  years old currently living with her boyfriend white female graduated from [employer]. .   I connected with Linford Arnold on 04/19/19 at  4:15 PM EST by telephone and verified that I am speaking with the correct person using two identifiers.   I discussed the limitations, risks, security and privacy concerns of performing an evaluation and management service by telephone and the availability of in person appointments. I also discussed with the patient that there may be a patient responsible charge related to this service. The patient expressed understanding and agreed to proceed.   Works for health department. Adding wellbutrin helped some but now again feeling somewhat subdued with decreased energy  Not taking trazadone regularly Sleep is fair  Modifying factors; boyfriend, pets Aggravating factors; some job stress    Past Psychiatric History: depression  Previous Psychotropic Medications: Yes   Substance Abuse History in the last 12 months:  Yes.    Consequences of Substance Abuse: NA  Past Medical History:  Past Medical History:  Diagnosis Date  . Asthma   . Depression     Past Surgical History:  Procedure Laterality Date  . TONSILLECTOMY      Family Psychiatric History: depression Mom; dad; siblings: uncle:  depression  Family History:  Family History  Problem Relation Age of Onset  . Diabetes Maternal Grandfather   . Diabetes Paternal Grandmother   . Cancer Paternal Grandfather     Social History:   Social History   Socioeconomic History  . Marital status:  Single    Spouse name: Not on file  . Number of children: Not on file  . Years of education: Not on file  . Highest education level: Not on file  Occupational History  . Not on file  Tobacco Use  . Smoking status: Never Smoker  . Smokeless tobacco: Never Used  Substance and Sexual Activity  . Alcohol use: Yes    Comment: 1-2 times weekly, wine or beer  . Drug use: Yes    Types: Marijuana    Comment: last used in September 2019  . Sexual activity: Yes    Birth control/protection: I.U.D.  Other Topics Concern  . Not on file  Social History Narrative  . Not on file   Social Determinants of Health   Financial Resource Strain:   . Difficulty of Paying Living Expenses: Not on file  Food Insecurity:   . Worried About Programme researcher, broadcasting/film/video in the Last Year: Not on file  . Ran Out of Food in the Last Year: Not on file  Transportation Needs:   . Lack of Transportation (Medical): Not on file  . Lack of Transportation (Non-Medical): Not on file  Physical Activity:   . Days of Exercise per Week: Not on file  . Minutes of Exercise per Session: Not on file  Stress:   . Feeling of Stress : Not on file  Social Connections:   . Frequency of Communication with Friends and Family: Not on file  . Frequency of Social Gatherings with Friends and Family: Not on file  .  Attends Religious Services: Not on file  . Active Member of Clubs or Organizations: Not on file  . Attends Archivist Meetings: Not on file  . Marital Status: Not on file        Allergies:   Allergies  Allergen Reactions  . Septra [Sulfamethoxazole-Trimethoprim]   . Sulfonamide Derivatives     REACTION: hives    Metabolic Disorder Labs: No results found for: HGBA1C, MPG Lab Results  Component Value Date   PROLACTIN 10.4 11/22/2012   No results found for: CHOL, TRIG, HDL, CHOLHDL, VLDL, LDLCALC   Current Medications: Current Outpatient Medications  Medication Sig Dispense Refill  . buPROPion  (WELLBUTRIN SR) 200 MG 12 hr tablet Take 1 tablet (200 mg total) by mouth daily. 30 tablet 1  . escitalopram (LEXAPRO) 20 MG tablet Take 1 tablet (20 mg total) by mouth daily. 30 tablet 1  . Levonorgestrel (KYLEENA IU) by Intrauterine route.    . lidocaine (XYLOCAINE) 2 % jelly APPLY 1 APPLICATION TOPICALLY AS NEEDED. (Patient not taking: Reported on 02/18/2018) 15 mL 2  . PROAIR RESPICLICK 893 (90 Base) MCG/ACT AEPB INHALE 2 PUFFS INTO THE LUNGS EVERY 4 (FOUR) HOURS AS NEEDED. 1 each 3   No current facility-administered medications for this visit.      Psychiatric Specialty Exam: Review of Systems  Cardiovascular: Negative for chest pain.  Musculoskeletal: Negative for back pain.  Skin: Negative for rash.  Psychiatric/Behavioral: Positive for depression.    There were no vitals taken for this visit.There is no height or weight on file to calculate BMI.  General Appearance:   Eye Contact:   Speech:  Slow  Volume:  Decreased  Mood: somewhat subdued  Affect:   Thought Process:  Goal Directed  Orientation:  Full (Time, Place, and Person)  Thought Content:  Rumination  Suicidal Thoughts:  No  Homicidal Thoughts:  No  Memory:  Immediate;   Fair Recent;   Fair  Judgement:  Fair  Insight:  Present  Psychomotor Activity:  Decreased  Concentration:  Concentration: Fair and Attention Span: Fair  Recall:  [employer] of Knowledge:Good  Language: Good  Akathisia:  No  Handed:  Right  AIMS (if indicated):    Assets:  Desire for Improvement  ADL's:  Intact  Cognition: WNL  Sleep:  poor    Treatment Plan Summary: Medication management and Plan as follows  MDD moderate, recurrent : somewhat subdued increase wellbutrin to 200mg  sr Continue lexapro  GAD"; manageable on lexapro, continue Insomnia: takes prn trazadone Reviewed meds,  Discussed activities and distraction from negative thoughts  The patient was advised to call back or seek an in-person evaluation if the symptoms  worsen or if the condition fails to improve as anticipated. Call back earlier if symptoms worsen   Fu 1-48m   Merian Capron, MD 1/5/20214:22 PM

## 2019-04-25 ENCOUNTER — Other Ambulatory Visit (HOSPITAL_COMMUNITY): Payer: Self-pay

## 2019-04-25 MED ORDER — ESCITALOPRAM OXALATE 20 MG PO TABS: 20.0000 mg | ORAL_TABLET | Freq: Every day | ORAL | 0 refills | Status: DC

## 2019-05-11 ENCOUNTER — Other Ambulatory Visit (HOSPITAL_COMMUNITY): Payer: Self-pay | Admitting: Psychiatry

## 2019-05-31 ENCOUNTER — Other Ambulatory Visit (HOSPITAL_COMMUNITY): Payer: Self-pay

## 2019-05-31 MED ORDER — BUPROPION HCL ER (SR) 200 MG PO TB12: 200.0000 mg | ORAL_TABLET | Freq: Every day | ORAL | 0 refills | Status: DC

## 2019-06-20 ENCOUNTER — Ambulatory Visit (INDEPENDENT_AMBULATORY_CARE_PROVIDER_SITE_OTHER): Payer: 59 | Admitting: Psychiatry

## 2019-06-20 ENCOUNTER — Encounter (HOSPITAL_COMMUNITY): Payer: Self-pay | Admitting: Psychiatry

## 2019-06-20 DIAGNOSIS — F331 Major depressive disorder, recurrent, moderate: Secondary | ICD-10-CM | POA: Diagnosis not present

## 2019-06-20 DIAGNOSIS — F411 Generalized anxiety disorder: Secondary | ICD-10-CM

## 2019-06-20 DIAGNOSIS — F5102 Adjustment insomnia: Secondary | ICD-10-CM

## 2019-06-20 NOTE — Progress Notes (Signed)
[facility] Tele psych  visit   Patient Identification: Anita Baird MRN:  [id number] Date of Evaluation:  06/20/2019 Referral Source: Primary care Chief Complaint:   depression follow up Visit Diagnosis:    ICD-10-CM   1. Major depressive disorder, recurrent episode, moderate (HCC)  F33.1   2. GAD (generalized anxiety disorder)  F41.1   3. Adjustment insomnia  F51.02     History of Present Illness: 29  years old currently living with her boyfriend white female graduated from [employer]. .     I connected with [name] on 06/20/19 at  4:00 PM EST by telephone and verified that I am speaking with the correct person using two identifiers.     I discussed the limitations, risks, security and privacy concerns of performing an evaluation and management service by telephone and the availability of in person appointments. I also discussed with the patient that there may be a patient responsible charge related to this service. The patient expressed understanding and agreed to proceed.   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    Consequences of Substance Abuse: NA  Past Medical History:  Past Medical History:  Diagnosis Date  . Asthma   . Depression     Past Surgical History:  Procedure Laterality Date  . TONSILLECTOMY      Family Psychiatric History: depression Mom; dad; siblings: uncle:  depression  Family History:  Family History  Problem Relation Age of Onset  . Diabetes Maternal Grandfather   . Diabetes Paternal Grandmother   . Cancer Paternal Grandfather     Social History:   Social History    Socioeconomic History  . Marital status: Single    Spouse name: Not on file  . Number of children: Not on file  . Years of education: Not on file  . Highest education level: Not on file  Occupational History  . Not on file  Tobacco Use  . Smoking status: Never Smoker  . Smokeless tobacco: Never Used  Substance and Sexual Activity  . Alcohol use: Yes    Comment: 1-2 times weekly, wine or beer  . Drug use: Yes    Types: Marijuana    Comment: last used in September 2019  . Sexual activity: Yes    Birth control/protection: I.U.D.  Other Topics Concern  . Not on file  Social History Narrative  . Not on file   Social Determinants of Health   Financial Resource Strain:   . Difficulty of Paying Living Expenses: Not on file  Food Insecurity:   . Worried About Charity fundraiser in the Last Year: Not on file  . Ran Out of Food in the Last Year: Not on file  Transportation Needs:   . Lack of Transportation (Medical): Not on file  . Lack of Transportation (Non-Medical): Not on file  Physical Activity:   . Days of Exercise per Week: Not on file  . Minutes of Exercise per Session: Not on file  Stress:   . Feeling of Stress : Not on file  Social Connections:   . Frequency of Communication with Friends and Family: Not on file  . Frequency of Social Gatherings  with Friends and Family: Not on file  . Attends Religious Services: Not on file  . Active Member of Clubs or Organizations: Not on file  . Attends Banker Meetings: Not on file  . Marital Status: Not on file        Allergies:   Allergies  Allergen Reactions  . Septra [Sulfamethoxazole-Trimethoprim]   . Sulfonamide Derivatives     REACTION: hives    Metabolic Disorder Labs: No results found for: HGBA1C, MPG Lab Results  Component Value Date   PROLACTIN 10.4 11/22/2012   No results found for: CHOL, TRIG, HDL, CHOLHDL, VLDL, LDLCALC   Current Medications: Current Outpatient Medications   Medication Sig Dispense Refill  . buPROPion (WELLBUTRIN SR) 200 MG 12 hr tablet Take 1 tablet (200 mg total) by mouth daily. 90 tablet 0  . escitalopram (LEXAPRO) 20 MG tablet Take 1 tablet (20 mg total) by mouth daily. 90 tablet 0  . Levonorgestrel (KYLEENA IU) by Intrauterine route.    . lidocaine (XYLOCAINE) 2 % jelly APPLY 1 APPLICATION TOPICALLY AS NEEDED. (Patient not taking: Reported on 02/18/2018) 15 mL 2  . PROAIR RESPICLICK 108 (90 Base) MCG/ACT AEPB INHALE 2 PUFFS INTO THE LUNGS EVERY 4 (FOUR) HOURS AS NEEDED. 1 each 3   No current facility-administered medications for this visit.      Psychiatric Specialty Exam: Review of Systems  Cardiovascular: Negative for chest pain.  Skin: Negative for rash.    There were no vitals taken for this visit.There is no height or weight on file to calculate BMI.  General Appearance:   Eye Contact:   Speech:  Slow  Volume:  Decreased  Mood: fair  Affect:   Thought Process:  Goal Directed  Orientation:  Full (Time, Place, and Person)  Thought Content:  Rumination  Suicidal Thoughts:  No  Homicidal Thoughts:  No  Memory:  Immediate;   Fair Recent;   Fair  Judgement:  Fair  Insight:  Present  Psychomotor Activity:  Decreased  Concentration:  Concentration: Fair and Attention Span: Fair  Recall:  [employer] of Knowledge:Good  Language: Good  Akathisia:  No  Handed:  Right  AIMS (if indicated):    Assets:  Desire for Improvement  ADL's:  Intact  Cognition: WNL  Sleep:  poor    Treatment Plan Summary: Medication management and Plan as follows  MDD moderate, recurrent : doing fair, continue lexapro wellbutrin GAD"; manageable on lexapro, continue Insomnia: takes prn trazadone Reviewed meds,  Discussed activities and distraction from negative thoughts  The patient was advised to call back or seek an in-person evaluation if the symptoms worsen or if the condition fails to improve as anticipated. Call back earlier if symptoms  worsen   Fu 2-12m. Time spent non face to face  Thresa Ross, MD 3/8/20214:06 PM

## 2019-07-11 ENCOUNTER — Other Ambulatory Visit (HOSPITAL_COMMUNITY): Payer: Self-pay

## 2019-07-11 MED ORDER — ESCITALOPRAM OXALATE 20 MG PO TABS: 20.0000 mg | ORAL_TABLET | Freq: Every day | ORAL | 0 refills | Status: DC

## 2019-07-13 ENCOUNTER — Other Ambulatory Visit (HOSPITAL_COMMUNITY): Payer: Self-pay

## 2019-07-13 MED ORDER — BUPROPION HCL ER (SR) 200 MG PO TB12: 200.0000 mg | ORAL_TABLET | Freq: Every day | ORAL | 0 refills | Status: DC

## 2019-09-18 ENCOUNTER — Other Ambulatory Visit (HOSPITAL_COMMUNITY): Payer: Self-pay | Admitting: Psychiatry

## 2019-09-28 ENCOUNTER — Telehealth (INDEPENDENT_AMBULATORY_CARE_PROVIDER_SITE_OTHER): Payer: 59 | Admitting: Psychiatry

## 2019-09-28 ENCOUNTER — Encounter (HOSPITAL_COMMUNITY): Payer: Self-pay | Admitting: Psychiatry

## 2019-09-28 DIAGNOSIS — F331 Major depressive disorder, recurrent, moderate: Secondary | ICD-10-CM

## 2019-09-28 DIAGNOSIS — F411 Generalized anxiety disorder: Secondary | ICD-10-CM | POA: Diagnosis not present

## 2019-09-28 MED ORDER — ESCITALOPRAM OXALATE 20 MG PO TABS: 20.0000 mg | ORAL_TABLET | Freq: Every day | ORAL | 0 refills | Status: DC

## 2019-09-28 MED ORDER — BUPROPION HCL ER (SR) 200 MG PO TB12: 200.0000 mg | ORAL_TABLET | Freq: Every day | ORAL | 0 refills | Status: DC

## 2019-09-28 NOTE — Progress Notes (Signed)
[facility] Tele psych  visit   Patient Identification: Anita Baird MRN:  [id number] Date of Evaluation:  09/28/2019 Referral Source: Primary care Chief Complaint:   depression follow up Visit Diagnosis:    ICD-10-CM   1. Major depressive disorder, recurrent episode, moderate (HCC)  F33.1   2. GAD (generalized anxiety disorder)  F41.1     History of Present Illness: 29  years old currently living with her boyfriend white female graduated from [employer] .    I connected with [name] on 09/28/19 at  1:30 PM EDT by telephone and verified that I am speaking with the correct person using two identifiers.   I discussed the limitations, risks, security and privacy concerns of performing an evaluation and management service by telephone and the availability of in person appointments. I also discussed with the patient that there may be a patient responsible charge related to this service. The patient expressed understanding and agreed to proceed.  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 Asthma   . Depression     Past Surgical History:  Procedure Laterality Date  . TONSILLECTOMY      Family Psychiatric History: depression Mom; dad; siblings: uncle:  depression  Family History:  Family History  Problem Relation Age of Onset  . Diabetes Maternal Grandfather   . Diabetes Paternal Grandmother   . Cancer Paternal Grandfather     Social History:   Social History   Socioeconomic History  .  Marital status: Single    Spouse name: Not on file  . Number of children: Not on file  . Years of education: Not on file  . Highest education level: Not on file  Occupational History  . Not on file  Tobacco Use  . Smoking status: Never Smoker  . Smokeless tobacco: Never Used  Vaping Use  . Vaping Use: Never used  Substance and Sexual Activity  . Alcohol use: Yes    Comment: 1-2 times weekly, wine or beer  . Drug use: Yes    Types: Marijuana    Comment: last used in September 2019  . Sexual activity: Yes    Birth control/protection: I.U.D.  Other Topics Concern  . Not on file  Social History Narrative  . Not on file   Social Determinants of Health   Financial Resource Strain:   . Difficulty of Paying Living Expenses:   Food Insecurity:   . Worried About Charity fundraiser in the Last Year:   . Arboriculturist in the Last Year:   Transportation Needs:   . Film/video editor (Medical):   Marland Kitchen Lack of Transportation (Non-Medical):   Physical Activity:   . Days of Exercise per Week:   . Minutes of Exercise per Session:   Stress:   . Feeling of Stress :   Social Connections:   . Frequency of Communication with Friends and Family:   . Frequency of Social Gatherings with Friends and Family:   . Attends Religious Services:   . Active Member of Clubs or Organizations:   .  Attends Banker Meetings:   Marland Kitchen Marital Status:         Allergies:   Allergies  Allergen Reactions  . Septra [Sulfamethoxazole-Trimethoprim]   . Sulfonamide Derivatives     REACTION: hives    Metabolic Disorder Labs: No results found for: HGBA1C, MPG Lab Results  Component Value Date   PROLACTIN 10.4 11/22/2012   No results found for: CHOL, TRIG, HDL, CHOLHDL, VLDL, LDLCALC   Current Medications: Current Outpatient Medications  Medication Sig Dispense Refill  . buPROPion (WELLBUTRIN SR) 200 MG 12 hr tablet Take 1 tablet (200 mg total) by mouth daily. 90 tablet 0  .  escitalopram (LEXAPRO) 20 MG tablet Take 1 tablet (20 mg total) by mouth daily. 90 tablet 0  . Levonorgestrel (KYLEENA IU) by Intrauterine route.    . lidocaine (XYLOCAINE) 2 % jelly APPLY 1 APPLICATION TOPICALLY AS NEEDED. (Patient not taking: Reported on 02/18/2018) 15 mL 2  . PROAIR RESPICLICK 108 (90 Base) MCG/ACT AEPB INHALE 2 PUFFS INTO THE LUNGS EVERY 4 (FOUR) HOURS AS NEEDED. 1 each 3   No current facility-administered medications for this visit.      Psychiatric Specialty Exam: Review of Systems  Cardiovascular: Negative for chest pain.  Skin: Negative for rash.    There were no vitals taken for this visit.There is no height or weight on file to calculate BMI.  General Appearance:   Eye Contact:   Speech:  Slow  Volume:  Decreased  Mood: fair  Affect:   Thought Process:  Goal Directed  Orientation:  Full (Time, Place, and Person)  Thought Content:  Rumination  Suicidal Thoughts:  No  Homicidal Thoughts:  No  Memory:  Immediate;   Fair Recent;   Fair  Judgement:  Fair  Insight:  Present  Psychomotor Activity:  Decreased  Concentration:  Concentration: Fair and Attention Span: Fair  Recall:  [employer] of Knowledge:Good  Language: Good  Akathisia:  No  Handed:  Right  AIMS (if indicated):    Assets:  Desire for Improvement  ADL's:  Intact  Cognition: WNL  Sleep:  poor    Treatment Plan Summary: Medication management and Plan as follows  MDD moderate, recurrent : stable. Continue wellbutrin GAD"; manageable on lexapro, continue Insomnia: takes prn trazadone Reviewed meds,  Discussed activities and distraction from negative thoughts  The patient was advised to call back or seek an in-person evaluation if the symptoms worsen or if the condition fails to improve as anticipated. Call back earlier if symptoms worsen   Fu 53m Time spent non face to face  Thresa Ross, MD 6/16/20211:34 PM

## 2019-12-07 ENCOUNTER — Other Ambulatory Visit (HOSPITAL_COMMUNITY): Payer: Self-pay | Admitting: Psychiatry

## 2020-01-12 ENCOUNTER — Other Ambulatory Visit (HOSPITAL_COMMUNITY): Payer: Self-pay | Admitting: Psychiatry

## 2020-02-27 ENCOUNTER — Telehealth (INDEPENDENT_AMBULATORY_CARE_PROVIDER_SITE_OTHER): Payer: 59 | Admitting: Psychiatry

## 2020-02-27 ENCOUNTER — Telehealth: Payer: Self-pay | Admitting: *Deleted

## 2020-02-27 ENCOUNTER — Encounter (HOSPITAL_COMMUNITY): Payer: Self-pay | Admitting: Psychiatry

## 2020-02-27 DIAGNOSIS — F5102 Adjustment insomnia: Secondary | ICD-10-CM

## 2020-02-27 DIAGNOSIS — F411 Generalized anxiety disorder: Secondary | ICD-10-CM | POA: Diagnosis not present

## 2020-02-27 DIAGNOSIS — F331 Major depressive disorder, recurrent, moderate: Secondary | ICD-10-CM

## 2020-02-27 NOTE — Telephone Encounter (Signed)
Patient called to schedule Anita Baird replacement but it is not due until 04/09/2021. Anita Baird was placed 04/09/2016 and last for 5 years.

## 2020-02-27 NOTE — Progress Notes (Signed)
[facility] Tele psych  visit   Patient Identification: Anita Baird MRN:  [id number] Date of Evaluation:  02/27/2020 Referral Source: Primary care Chief Complaint:   depression follow up Visit Diagnosis:    ICD-10-CM   1. Major depressive disorder, recurrent episode, moderate (HCC)  F33.1   2. GAD (generalized anxiety disorder)  F41.1   3. Adjustment insomnia  F51.02     History of Present Illness: 29  years old currently living with her boyfriend white female graduated from [employer]. .    I connected with Linford Arnold on 02/27/20 at 10:15 AM EST by telephone and verified that I am speaking with the correct person using two identifiers.    I discussed the limitations, risks, security and privacy concerns of performing an evaluation and management service by telephone and the availability of in person appointments. I also discussed with the patient that there may be a patient responsible charge related to this service. The patient expressed understanding and agreed to proceed.  Patient location : home Provider location : home office  Works for health department. Married now, planning to move [place], looking for house at [employer]   Doing fair lexapro helps depression Not taking trazadone regularly   Modifying factors;husband, pets Aggravating factors; job stress but planning to move  Past Psychiatric History: depression  Previous Psychotropic Medications: Yes   Substance Abuse History in the last 12 months:  Consequences of Substance Abuse: NA  Past Medical History:  Past Medical History:  Diagnosis Date  . Asthma   . Depression     Past Surgical History:  Procedure Laterality Date  . TONSILLECTOMY      Family Psychiatric History: depression Mom; dad; siblings: uncle:  depression  Family History:  Family History  Problem Relation Age of Onset  . Diabetes Maternal Grandfather   . Diabetes Paternal Grandmother   . Cancer Paternal Grandfather     Social  History:   Social History   Socioeconomic History  . Marital status: Single    Spouse name: Not on file  . Number of children: Not on file  . Years of education: Not on file  . Highest education level: Not on file  Occupational History  . Not on file  Tobacco Use  . Smoking status: Never Smoker  . Smokeless tobacco: Never Used  Vaping Use  . Vaping Use: Never used  Substance and Sexual Activity  . Alcohol use: Yes    Comment: 1-2 times weekly, wine or beer  . Drug use: Yes    Types: Marijuana    Comment: last used in September 2019  . Sexual activity: Yes    Birth control/protection: I.U.D.  Other Topics Concern  . Not on file  Social History Narrative  . Not on file   Social Determinants of Health   Financial Resource Strain:   . Difficulty of Paying Living Expenses: Not on file  Food Insecurity:   . Worried About Programme researcher, broadcasting/film/video in the Last Year: Not on file  . Ran Out of Food in the Last Year: Not on file  Transportation Needs:   . Lack of Transportation (Medical): Not on file  . Lack of Transportation (Non-Medical): Not on file  Physical Activity:   . Days of Exercise per Week: Not on file  . Minutes of Exercise per Session: Not on file  Stress:   . Feeling of Stress : Not on file  Social Connections:   . Frequency of Communication with Friends and Family:  Not on file  . Frequency of Social Gatherings with Friends and Family: Not on file  . Attends Religious Services: Not on file  . Active Member of Clubs or Organizations: Not on file  . Attends Banker Meetings: Not on file  . Marital Status: Not on file        Allergies:   Allergies  Allergen Reactions  . Septra [Sulfamethoxazole-Trimethoprim]   . Sulfonamide Derivatives     REACTION: hives    Metabolic Disorder Labs: No results found for: HGBA1C, MPG Lab Results  Component Value Date   PROLACTIN 10.4 11/22/2012   No results found for: CHOL, TRIG, HDL, CHOLHDL, VLDL,  LDLCALC   Current Medications: Current Outpatient Medications  Medication Sig Dispense Refill  . buPROPion (WELLBUTRIN SR) 200 MG 12 hr tablet TAKE 1 TABLET BY MOUTH  DAILY 90 tablet 0  . escitalopram (LEXAPRO) 20 MG tablet TAKE 1 TABLET BY MOUTH  DAILY 90 tablet 0  . Levonorgestrel (KYLEENA IU) by Intrauterine route.    . lidocaine (XYLOCAINE) 2 % jelly APPLY 1 APPLICATION TOPICALLY AS NEEDED. (Patient not taking: Reported on 02/18/2018) 15 mL 2  . PROAIR RESPICLICK 108 (90 Base) MCG/ACT AEPB INHALE 2 PUFFS INTO THE LUNGS EVERY 4 (FOUR) HOURS AS NEEDED. 1 each 3   No current facility-administered medications for this visit.      Psychiatric Specialty Exam: Review of Systems  Cardiovascular: Negative for chest pain.  Skin: Negative for rash.    There were no vitals taken for this visit.There is no height or weight on file to calculate BMI.  General Appearance:   Eye Contact:   Speech:  Slow  Volume:  Decreased  Mood: fair  Affect:   Thought Process:  Goal Directed  Orientation:  Full (Time, Place, and Person)  Thought Content:  Rumination  Suicidal Thoughts:  No  Homicidal Thoughts:  No  Memory:  Immediate;   Fair Recent;   Fair  Judgement:  Fair  Insight:  Present  Psychomotor Activity:  Decreased  Concentration:  Concentration: Fair and Attention Span: Fair  Recall:  [employer] of Knowledge:Good  Language: Good  Akathisia:  No  Handed:  Right  AIMS (if indicated):    Assets:  Desire for Improvement  ADL's:  Intact  Cognition: WNL  Sleep:  poor    Treatment Plan Summary: Medication management and Plan as follows  MDD moderate, recurrent : stable, conitnue wellbutrin, has meds for now  Patient can call for refill and can be discharged since moving to [place], she was informed to make appointment with local provider In [place]. GAD"; manageable on lexapro, continue Insomnia: takes prn trazadone Reviewed meds,  Discussed activities and distraction from negative  thoughts  The patient was advised to call back or seek an in-person evaluation if the symptoms worsen or if the condition fails to improve as anticipated. Moving to [place], can be discharged and call for refill Time spent non face to face  Thresa Ross, MD 11/15/202110:28 AM

## 2020-04-06 ENCOUNTER — Other Ambulatory Visit (HOSPITAL_COMMUNITY): Payer: Self-pay | Admitting: Psychiatry

## 2020-04-22 ENCOUNTER — Other Ambulatory Visit (HOSPITAL_COMMUNITY): Payer: Self-pay | Admitting: Psychiatry

## 2021-02-21 ENCOUNTER — Encounter: Payer: Self-pay | Admitting: Physician Assistant
